# Patient Record
Sex: Female | Born: 1975 | Race: White | Hispanic: No | Smoking: Never smoker
Health system: Southern US, Community
[De-identification: ages and names within clinical notes are randomized; demographics above are authoritative.]

---

## 2004-07-19 ENCOUNTER — Other Ambulatory Visit: Admission: RE | Admit: 2004-07-19 | Discharge: 2004-07-19 | Payer: Self-pay | Admitting: Internal Medicine

## 2005-02-14 ENCOUNTER — Other Ambulatory Visit: Admission: RE | Admit: 2005-02-14 | Discharge: 2005-02-14 | Payer: Self-pay | Admitting: Obstetrics and Gynecology

## 2005-05-26 ENCOUNTER — Other Ambulatory Visit: Admission: RE | Admit: 2005-05-26 | Discharge: 2005-05-26 | Payer: Self-pay | Admitting: Obstetrics and Gynecology

## 2005-06-12 ENCOUNTER — Inpatient Hospital Stay (HOSPITAL_COMMUNITY): Admission: AD | Admit: 2005-06-12 | Discharge: 2005-06-12 | Payer: Self-pay | Admitting: Obstetrics and Gynecology

## 2005-06-27 ENCOUNTER — Ambulatory Visit: Payer: Self-pay | Admitting: Cardiology

## 2005-06-30 ENCOUNTER — Encounter: Payer: Self-pay | Admitting: Cardiology

## 2005-06-30 ENCOUNTER — Ambulatory Visit: Payer: Self-pay

## 2005-07-25 ENCOUNTER — Inpatient Hospital Stay (HOSPITAL_COMMUNITY): Admission: EM | Admit: 2005-07-25 | Discharge: 2005-07-27 | Payer: Self-pay | Admitting: Obstetrics and Gynecology

## 2005-08-04 ENCOUNTER — Ambulatory Visit: Admission: RE | Admit: 2005-08-04 | Discharge: 2005-08-04 | Payer: Self-pay | Admitting: Obstetrics and Gynecology

## 2005-09-25 ENCOUNTER — Other Ambulatory Visit: Admission: RE | Admit: 2005-09-25 | Discharge: 2005-09-25 | Payer: Self-pay | Admitting: Obstetrics and Gynecology

## 2009-07-27 ENCOUNTER — Other Ambulatory Visit: Admission: RE | Admit: 2009-07-27 | Discharge: 2009-07-27 | Payer: Self-pay | Admitting: Internal Medicine

## 2010-02-07 ENCOUNTER — Ambulatory Visit (HOSPITAL_COMMUNITY): Admission: RE | Admit: 2010-02-07 | Discharge: 2010-02-07 | Payer: Self-pay | Admitting: Obstetrics and Gynecology

## 2010-08-11 LAB — CBC
MCH: 32.6 pg (ref 26.0–34.0)
MCHC: 34.7 g/dL (ref 30.0–36.0)
Platelets: 232 10*3/uL (ref 150–400)
RBC: 3.95 MIL/uL (ref 3.87–5.11)

## 2010-08-11 LAB — PREGNANCY, URINE: Preg Test, Ur: NEGATIVE

## 2010-10-14 NOTE — H&P (Signed)
NAME:  Kathy Stanley, Kathy Stanley NO.:  [id number]   MEDICAL RECORD NO.:  [id number]          PATIENT TYPE:  AMB   LOCATION:  SDC                           FACILITY:  [facility]   PHYSICIAN:  Huel Cote, M.D. DATE OF BIRTH:  03/22/1976   DATE OF ADMISSION:  DATE OF DISCHARGE:                                HISTORY & PHYSICAL   HISTORY OF PRESENT ILLNESS:  The patient is a 35 year old G1, P1, who is  coming in for an outpatient LEEP procedure performed under sedation.  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  Her ECC on this specimen was negative.  During the procedure of  her colposcopy the patient had two vagal episodes and it took [place] quite a bit  of supportive care to get her through the procedure.  Therefore it is felt  best that she have her LEEP under sedation.   PAST MEDICAL HISTORY:  Significant for anxiety and depression.  The patient  reports that she has had an anxious nature her entire life and has had  several episodes of syncope related to stress events.   PAST SURGICAL HISTORY:  None.   PAST GYN HISTORY:  Previous Pap smears as stated.   PAST OBSTETRICAL HISTORY:  She had normal spontaneous vaginal delivery in  2002.   CURRENT MEDICATIONS:  Lexapro and doxycycline.   ALLERGIES:  1.  She is allergic to [facility].  2.  SULFA DRUGS in general.   SOCIAL HISTORY:  She is in a steady relationship and is planning on getting  married in the fall.  Her current birth control is condoms and withdrawal  and her menses are regular.   PHYSICAL EXAMINATION:  VITAL SIGNS:  The patient's height is 5 feet 1 inch,  weight 125 pounds.  CARDIAC:  Regular rate and rhythm.  LUNGS:  Clear.  ABDOMEN:  Soft and nontender.  PELVIC:  On pelvic exam she has normal external genitalia.  Her uterus is  normal in size.  Adnexa have no masses.  Her colposcopy  was as stated with  abnormal biopsies in two positions on the cervix.  We will have the patient  perform a pregnancy test on the day of surgery, given her sketchy  contraception.  The risks and benefits of the procedure were explained to  the patient in detail.  She understands the risk of bleeding and infection  and possible weakening of the cervix.  The patient desires to proceed with  the procedure as stated.      KR/MEDQ  D:  10/10/2004  T:  10/10/2004  Job:  [id number]

## 2010-10-14 NOTE — Discharge Summary (Signed)
[name], Stanley NO.:  [id number]   MEDICAL RECORD NO.:  [id number]          PATIENT TYPE:  INP   LOCATION:  9137                          FACILITY:  [facility]   PHYSICIAN:  Huel Cote, M.D. DATE OF BIRTH:  [birth year]   DATE OF ADMISSION:  07/25/2005  DATE OF DISCHARGE:  07/27/2005                                 DISCHARGE SUMMARY   DISCHARGE DIAGNOSES:  1.  Term pregnancy at 40+ weeks delivered.  2.  Status post normal spontaneous vaginal delivery.   DISCHARGE MEDICATIONS:  1.  Motrin 600 mg every six hours.  2.  Percocet one to two tablets p.r.n. every four hours.   HOSPITAL COURSE:  The patient is a 35 year old, gravida 2, para 1-0-0-1, who  came in at [redacted] weeks gestation for induction of labor with favorable cervix.  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  The  patient had declined LEEP procedure for this treatment and was followed with  colpo and Pap smear at 32 weeks.  She also had some episodes of tachycardia  for which she saw a cardiologist and was felt benign.  Prenatal labs were as  follows:  A positive, antibody negative, RPR nonreactive.  Rubella immune.  Hepatitis B surface antigen negative.  HIV nonreactive.  GC negative,  Chlamydia negative.  Group B strep negative.   OB HISTORY:  In May 2002 she had a vaginal delivery of a 6 pound 10 ounce  infant.   GYN HISTORY:  CIN-II.  Needs a colpo and Pap smear postpartum.   PAST MEDICAL HISTORY:  History of anxiety and depression not requiring  medications.   PAST SURGICAL HISTORY:  None.   ALLERGIES:  None.   On admission she was afebrile with 5/5 stable. Cervix shortly after  admission on Pitocin was 75, 5, -1 station.  She quickly reached complete  dilation and pushed for approximately 40 minutes with normal spontaneous  vaginal delivery of a viable female infant over an intact perineum.  Apgars  were 7 and 9, weight 7 pounds 15 ounces.  The baby rotated OP to OA  with  pushing.  A moderate shoulder dystocia was controlled with McRoberts and  suprapubic pressure with a corkscrew maneuver.  Perineum, cervix and rectum  intact.  The patient then did very well postpartum.  On postpartum day #2,  she had no complaints.  She was afebrile with stable vital signs and was  felt stable for discharge home.     Huel Cote, M.D.  Electronically Signed    KR/MEDQ  D:  08/17/2005  T:  08/18/2005  Job:  [id number]

## 2019-03-18 ENCOUNTER — Other Ambulatory Visit: Payer: Self-pay

## 2019-03-18 DIAGNOSIS — Z20822 Contact with and (suspected) exposure to covid-19: Secondary | ICD-10-CM

## 2019-03-19 LAB — NOVEL CORONAVIRUS, NAA: SARS-CoV-2, NAA: NOT DETECTED

## 2019-04-04 ENCOUNTER — Other Ambulatory Visit: Payer: Self-pay

## 2019-04-04 DIAGNOSIS — Z20822 Contact with and (suspected) exposure to covid-19: Secondary | ICD-10-CM

## 2019-04-06 LAB — NOVEL CORONAVIRUS, NAA: SARS-CoV-2, NAA: NOT DETECTED

## 2019-04-11 ENCOUNTER — Other Ambulatory Visit: Payer: Self-pay

## 2019-04-11 DIAGNOSIS — Z20822 Contact with and (suspected) exposure to covid-19: Secondary | ICD-10-CM

## 2019-04-14 LAB — NOVEL CORONAVIRUS, NAA: SARS-CoV-2, NAA: NOT DETECTED

## 2020-07-26 ENCOUNTER — Emergency Department (HOSPITAL_COMMUNITY): Payer: 59

## 2020-07-26 ENCOUNTER — Emergency Department (HOSPITAL_COMMUNITY)
Admission: EM | Admit: 2020-07-26 | Discharge: 2020-07-26 | Disposition: A | Discharge status: Home / Self Care | Payer: 59 | Attending: Emergency Medicine | Admitting: Emergency Medicine

## 2020-07-26 ENCOUNTER — Other Ambulatory Visit: Payer: Self-pay

## 2020-07-26 ENCOUNTER — Encounter (HOSPITAL_COMMUNITY): Payer: Self-pay

## 2020-07-26 DIAGNOSIS — S6991XA Unspecified injury of right wrist, hand and finger(s), initial encounter: Secondary | ICD-10-CM | POA: Diagnosis present

## 2020-07-26 DIAGNOSIS — M79641 Pain in right hand: Secondary | ICD-10-CM

## 2020-07-26 DIAGNOSIS — S60221A Contusion of right hand, initial encounter: Secondary | ICD-10-CM | POA: Insufficient documentation

## 2020-07-26 DIAGNOSIS — W228XXA Striking against or struck by other objects, initial encounter: Secondary | ICD-10-CM | POA: Diagnosis not present

## 2020-07-26 MED ORDER — IBUPROFEN 800 MG PO TABS
800.0000 mg | ORAL_TABLET | Freq: Once | ORAL | Status: AC
  Administered 2020-07-26: 800 mg via ORAL
  Filled 2020-07-26: qty 1

## 2020-07-26 NOTE — ED Triage Notes (Signed)
Patient states she was half asleep and hit her right hand on the door jamb.

## 2020-07-26 NOTE — ED Provider Notes (Signed)
[facility]-EMERGENCY DEPT Provider Note   CSN: [id number] Arrival date & time: 07/26/20  1248     History Chief Complaint  Patient presents with  . Hand Injury    Kathy Stanley is a 45 y.o. female with no significant past medical history who presents to the ED due to right hand pain x1 day.  Patient states she slammed her right hand into a door yesterday.  Pain is located around 1st metacarpal associated with ecchymosis on thenar eminence.  Patient is right-hand dominant.  No previous right hand injuries.  Pain is worse with movement of right thumb.  Patient rates her pain a 7/10.  She has been taking Tylenol with moderate relief.  Denies associated numbness/tingling.  History obtained from patient and past medical records. No interpreter used during encounter.      History reviewed. No pertinent past medical history.  There are no problems to display for this patient.   History reviewed. No pertinent surgical history.   OB History   No obstetric history on file.     Family History  Problem Relation Age of Onset  . Bipolar disorder Mother     Social History   Tobacco Use  . Smoking status: Never Smoker  . Smokeless tobacco: Never Used  Vaping Use  . Vaping Use: Never used  Substance Use Topics  . Alcohol use: Yes  . Drug use: Never    Home Medications Prior to Admission medications   Not on File    Allergies    Septra [sulfamethoxazole-trimethoprim]  Review of Systems   Review of Systems  Musculoskeletal: Positive for arthralgias.  Neurological: Negative for numbness.    Physical Exam Updated Vital Signs BP 137/84 (BP Location: Left Arm)   Pulse 89   Temp 98.7 F (37.1 C) (Oral)   Resp 16   Ht 5' (1.524 m)   Wt 63.5 kg   LMP 07/12/2020   SpO2 100%   BMI 27.34 kg/m   Physical Exam Vitals and nursing note reviewed.  Constitutional:      General: She is not in acute distress. HENT:     Head: Normocephalic.  Eyes:      Pupils: Pupils are equal, round, and reactive to light.  Cardiovascular:     Rate and Rhythm: Normal rate and regular rhythm.     Pulses: Normal pulses.     Heart sounds: Normal heart sounds. No murmur heard. No friction rub. No gallop.   Pulmonary:     Effort: Pulmonary effort is normal.     Breath sounds: Normal breath sounds.  Abdominal:     General: Abdomen is flat. There is no distension.     Palpations: Abdomen is soft.     Tenderness: There is no abdominal tenderness. There is no guarding or rebound.  Musculoskeletal:     Cervical back: Neck supple.     Comments: Tenderness throughout right thumb. +snuffbox tenderness. Limited ROM of right thumb.  Radial pulse intact.  Soft compartments.  Ecchymosis over right thenar eminence.  Skin:    General: Skin is warm and dry.  Neurological:     General: No focal deficit present.  Psychiatric:        Mood and Affect: Mood normal.        Behavior: Behavior normal.     ED Results / Procedures / Treatments   Labs (all labs ordered are listed, but only abnormal results are displayed) Labs Reviewed - No data to display  EKG  None  Radiology DG Hand Complete Right  Result Date: 07/26/2020 CLINICAL DATA:  Right hand injury. EXAM: RIGHT HAND - COMPLETE 3+ VIEW COMPARISON:  None. FINDINGS: Mild soft tissue swelling. Normal alignment with approximation of the joints. No fracture or focal osseous lesion. No radiopaque foreign body. IMPRESSION: No acute osseous abnormality.  Mild soft tissue swelling. Electronically Signed   By: Stana Bunting M.D.   On: 07/26/2020 13:40    Procedures Procedures   Medications Ordered in ED Medications  ibuprofen (ADVIL) tablet 800 mg (800 mg Oral Given 07/26/20 1401)    ED Course  I have reviewed the triage vital signs and the nursing notes.  Pertinent labs & imaging results that were available during my care of the patient were reviewed by me and considered in my medical decision making (see  chart for details).    MDM Rules/Calculators/A&P                         45 year old female presents to the ED due to right hand pain x1 day after slamming it into a door.  Patient is right-hand dominant.  Upon arrival, vitals all within normal limits.  Patient in no acute distress and non-ill-appearing.  Physical exam significant for right snuffbox tenderness with decreased range of motion of right thumb.  Right upper extremity neurovascularly intact with soft compartments.  Doubt compartment syndrome.  Ecchymosis over thenar eminence.  Ibuprofen given for pain management.  X-ray ordered at triage which I personally reviewed which is negative for any bony fractures.  Given snuffbox tenderness, patient placed in thumb spica.  Hand surgeon number given at discharge.  Advised patient to call today or tomorrow to schedule an appointment for further evaluation.  Encouraged  patient take ibuprofen or Tylenol as needed for pain.  RICE discussed with patient. Strict ED precautions discussed with patient. Patient states understanding and agrees to plan. Patient discharged home in no acute distress and stable vitals.  Final Clinical Impression(s) / ED Diagnoses Final diagnoses:  Right hand pain    Rx / DC Orders ED Discharge Orders    None       Jesusita Oka 07/26/20 1409    Tegeler, Canary Brim, MD 07/26/20 1750

## 2020-07-26 NOTE — ED Notes (Signed)
An After Visit Summary was printed and given to the patient. Discharge instructions given and no further questions at this time.  

## 2020-07-26 NOTE — Discharge Instructions (Addendum)
As discussed, your x-ray did not show any broken bones.  Given the location of your pain I have placed you in a splint.  I have included the number of the hand surgeon.  Please call to schedule an appointment for further evaluation.  You may take over-the-counter ibuprofen or Tylenol as needed for pain.  Continue to ice and elevate your right hand.  Return to the ER for new or worsening symptoms.

## 2020-08-02 ENCOUNTER — Ambulatory Visit (INDEPENDENT_AMBULATORY_CARE_PROVIDER_SITE_OTHER): Payer: 59 | Admitting: Physician Assistant

## 2020-08-02 ENCOUNTER — Encounter: Payer: Self-pay | Admitting: Physician Assistant

## 2020-08-02 DIAGNOSIS — M79644 Pain in right finger(s): Secondary | ICD-10-CM | POA: Diagnosis not present

## 2020-08-02 NOTE — Progress Notes (Signed)
   Office Visit Note   Patient: Kathy Stanley           Date of Birth: November 08, 1975           MRN: [id number] Visit Date: 08/02/2020              Requested by: No referring provider defined for this encounter. PCP: Patient, No Pcp Per   Assessment & Plan: Visit Diagnoses:  1. Thumb pain, right     Plan: We will have her continue the thumb spica splint on the right hand.  Begin using Voltaren gel 2 g 4 times daily to the right thumb.  Follow-up with [place] in 2 weeks she continues to have pain we will repeat radiographs to rule out occult fracture.  Questions were encouraged and answered at length.  Follow-Up Instructions: Return in about 2 weeks (around 08/16/2020).   Orders:  No orders of the defined types were placed in this encounter.  No orders of the defined types were placed in this encounter.     Procedures: No procedures performed   Clinical Data: No additional findings.   Subjective: Chief Complaint  Patient presents with  . Right Hand - Pain    HPI Patient is 45 year old female were seen for the first time for right thumb pain.  She was seen in the ER on 07/26/2020 after jamming her fingers on the door jam the middle of the night.  She is unsure of the exact mechanism of injury.  She had radiographs which I reviewed of her right hand in the ER.  These show no acute fractures no bony abnormalities.  She is placed appropriately in a removable wrist splint with thumb spica.  She has been taking ibuprofen.  She is slowly trending towards improvement.  She is right-hand dominant. Review of Systems See HPI otherwise negative  Objective: Vital Signs: LMP 07/12/2020   Physical Exam General: Well-developed well-nourished female no acute distress. Psych: Alert and oriented x3 Ortho Exam Right hand slight edema of the right thumb compared to the left thumb.  She has near full extension of both thumbs.  She has full flexion of both thumbs.  She has tenderness over the first  extensor compartment region of the right thumb region.  No gross instability with valgus varus stressing of the right thumb.  Nontender over the right snuffbox region.  Full sensation throughout the hand.  Radial pulse on the right is 2+. Specialty Comments:  No specialty comments available.  Imaging: No results found.   PMFS History: There are no problems to display for this patient.  History reviewed. No pertinent past medical history.  Family History  Problem Relation Age of Onset  . Bipolar disorder Mother     History reviewed. No pertinent surgical history. Social History   Occupational History  . Not on file  Tobacco Use  . Smoking status: Never Smoker  . Smokeless tobacco: Never Used  Vaping Use  . Vaping Use: Never used  Substance and Sexual Activity  . Alcohol use: Yes  . Drug use: Never  . Sexual activity: Not on file

## 2020-08-25 ENCOUNTER — Ambulatory Visit (INDEPENDENT_AMBULATORY_CARE_PROVIDER_SITE_OTHER): Payer: 59

## 2020-08-25 ENCOUNTER — Encounter: Payer: Self-pay | Admitting: Physician Assistant

## 2020-08-25 ENCOUNTER — Ambulatory Visit (INDEPENDENT_AMBULATORY_CARE_PROVIDER_SITE_OTHER): Payer: 59 | Admitting: Physician Assistant

## 2020-08-25 DIAGNOSIS — M79644 Pain in right finger(s): Secondary | ICD-10-CM

## 2020-08-25 NOTE — Progress Notes (Signed)
HPI: Kathy Stanley returns today follow-up of her right thumb pain.  Injury occurred on 07/26/2020 when she jammed her finger into a door jam the middle night.  She has been wearing the wrist splint with thumb spica.  She feels a clicking-like sensation she states this is audible.  She did feel that the thumb was getting better and then it got worse.  Physical exam: Right hand slight edema of the right thumb compared to left.  She has full extension of both thumbs.  Full flexion of the left thumb.  Slightly decreased flexion of the right thumb through the IP joint and it MP joint.  Nontender over the snuffbox region.  No gross instability valgus varus stressing right thumb.  Remainder the hand is nontender.  Radiographs: Right thumb 3 views shows no acute fracture.  No subluxation dislocation.  No bony abnormalities.  No evidence of fracture involving the scaphoid.  Impression: Right thumb pain Differential bony contusion right thumb  Plan: We will discontinue the thumb spica splint.  She is work on gentle range of motion.  We will send her to formal therapy for her hand therapist to work on range of motion of the thumb to include modalities.  Prescriptions given.  She will follow-up with [place] in 4 weeks to check her progress.  Questions were encouraged and answered.

## 2021-02-05 ENCOUNTER — Other Ambulatory Visit: Payer: Self-pay

## 2021-02-05 ENCOUNTER — Encounter (HOSPITAL_COMMUNITY): Payer: Self-pay | Admitting: Emergency Medicine

## 2021-02-05 ENCOUNTER — Emergency Department (HOSPITAL_COMMUNITY): Payer: 59

## 2021-02-05 ENCOUNTER — Emergency Department (HOSPITAL_COMMUNITY)
Admission: EM | Admit: 2021-02-05 | Discharge: 2021-02-06 | Disposition: A | Discharge status: Home / Self Care | Payer: 59 | Attending: Emergency Medicine | Admitting: Emergency Medicine

## 2021-02-05 DIAGNOSIS — M79645 Pain in left finger(s): Secondary | ICD-10-CM | POA: Insufficient documentation

## 2021-02-05 DIAGNOSIS — S6992XA Unspecified injury of left wrist, hand and finger(s), initial encounter: Secondary | ICD-10-CM | POA: Diagnosis present

## 2021-02-05 DIAGNOSIS — S0512XA Contusion of eyeball and orbital tissues, left eye, initial encounter: Secondary | ICD-10-CM | POA: Diagnosis not present

## 2021-02-05 DIAGNOSIS — T07XXXA Unspecified multiple injuries, initial encounter: Secondary | ICD-10-CM

## 2021-02-05 DIAGNOSIS — S63259A Unspecified dislocation of unspecified finger, initial encounter: Secondary | ICD-10-CM

## 2021-02-05 DIAGNOSIS — S63287A Dislocation of proximal interphalangeal joint of left little finger, initial encounter: Secondary | ICD-10-CM | POA: Diagnosis not present

## 2021-02-05 MED ORDER — LIDOCAINE HCL (PF) 1 % IJ SOLN
30.0000 mL | Freq: Once | INTRAMUSCULAR | Status: AC
  Administered 2021-02-05: 30 mL
  Filled 2021-02-05: qty 30

## 2021-02-05 MED ORDER — OXYCODONE-ACETAMINOPHEN 5-325 MG PO TABS
2.0000 | ORAL_TABLET | Freq: Once | ORAL | Status: AC
  Administered 2021-02-05: 2 via ORAL
  Filled 2021-02-05: qty 2
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
## 2021-02-06 ENCOUNTER — Emergency Department (HOSPITAL_COMMUNITY): Payer: 59

## 2021-02-06 DIAGNOSIS — S63287A Dislocation of proximal interphalangeal joint of left little finger, initial encounter: Secondary | ICD-10-CM | POA: Diagnosis not present

## 2021-02-06 MED ORDER — OXYCODONE-ACETAMINOPHEN 5-325 MG PO TABS: 1.0000 | ORAL_TABLET | Freq: Three times a day (TID) | ORAL | 0 refills | Status: DC | PRN

## 2021-03-03 ENCOUNTER — Ambulatory Visit (INDEPENDENT_AMBULATORY_CARE_PROVIDER_SITE_OTHER): Payer: 59

## 2021-03-03 ENCOUNTER — Ambulatory Visit (INDEPENDENT_AMBULATORY_CARE_PROVIDER_SITE_OTHER): Payer: 59 | Admitting: Orthopedic Surgery

## 2021-03-03 ENCOUNTER — Encounter: Payer: Self-pay | Admitting: Orthopedic Surgery

## 2021-03-03 VITALS — BP 135/85 | HR 94 | Ht 60.0 in | Wt 152.8 lb

## 2021-03-03 DIAGNOSIS — M79645 Pain in left finger(s): Secondary | ICD-10-CM | POA: Diagnosis not present

## 2021-03-03 DIAGNOSIS — S63285A Dislocation of proximal interphalangeal joint of left ring finger, initial encounter: Secondary | ICD-10-CM

## 2021-03-03 NOTE — Progress Notes (Signed)
Office Visit Note   Patient: Kathy Stanley           Date of Birth: 1976/03/09           MRN: [id number] Visit Date: 03/03/2021              Requested by: No referring provider defined for this encounter. PCP: Patient, No Pcp Per (Inactive)   Assessment & Plan: Visit Diagnoses:  1. Pain in left finger(s)   2. Closed traumatic dislocation of proximal interphalangeal (PIP) joint of left ring finger     Plan: Discussed with patient that the x-rays taken today show that the ring finger PIP joint has remained concentrically reduced.  We discussed the nature of PIP injury with prolonged swelling and propensity for stiffness. I will refer her to hand therapy to work on edema control and ROM.   Follow-Up Instructions: No follow-ups on file.   Orders:  Orders Placed This Encounter  Procedures   XR Finger Ring Left   Ambulatory referral to Occupational Therapy   No orders of the defined types were placed in this encounter.     Procedures: No procedures performed   Clinical Data: No additional findings.   Subjective: Chief Complaint  Patient presents with   Left Hand - Follow-up    This is a 45 yo RHD F who presents with left ring finger pain, swelling, and stiffness after a dislocation on 9/10.  She was seen in the ER where the PIP joint was closed reduced.  She did not follow up with hand surgery.  She describes continued but improved swelling and pain at the PIP joint.  She has tried to work on moving the finger.  She denies injury to other joints in this finger or the remainder of her hand.    Review of Systems  Constitutional: Negative.   Respiratory: Negative.    Cardiovascular: Negative.   Skin: Negative.     Objective: Vital Signs: BP 135/85 (BP Location: Right Arm, Patient Position: Sitting, Cuff Size: Normal)   Pulse 94   Ht 5' (1.524 m)   Wt 152 lb 12.8 oz (69.3 kg)   SpO2 96%   BMI 29.84 kg/m   Physical Exam Constitutional:      Appearance: Normal  appearance.  Cardiovascular:     Rate and Rhythm: Normal rate.     Pulses: Normal pulses.  Pulmonary:     Effort: Pulmonary effort is normal.  Skin:    General: Skin is warm and dry.     Capillary Refill: Capillary refill takes less than 2 seconds.  Neurological:     Mental Status: She is alert.    Left Hand Exam   Tenderness  Left hand tenderness location: Mild to moderate TTP over dorsal and ulnar aspect of PIPJ.   Range of Motion  The patient has normal left wrist ROM.  Muscle Strength  The patient has normal left wrist strength.  Other  Erythema: absent Sensation: normal Pulse: present  Comments:  Moderate swelling of ring finger PIP joint.  ROM of PIP from 0-70 degrees.  Full and painless ROM at ring finger MP and DIPJs.      Specialty Comments:  No specialty comments available.  Imaging: 3V of the left ring finger taken today are reviewed and interpreted by me.  They demonstrate concentric reduction of the ring finger PIP joint w/out evidence of prior fracture.  There are no appreciable degenerative changes at the PIP joint.    PMFS  History: There are no problems to display for this patient.  History reviewed. No pertinent past medical history.  Family History  Problem Relation Age of Onset   Bipolar disorder Mother     History reviewed. No pertinent surgical history. Social History   Occupational History   Not on file  Tobacco Use   Smoking status: Never   Smokeless tobacco: Never  Vaping Use   Vaping Use: Never used  Substance and Sexual Activity   Alcohol use: Yes   Drug use: Never   Sexual activity: Not on file

## 2021-05-30 ENCOUNTER — Telehealth: Payer: Medicaid Other | Admitting: Physician Assistant

## 2021-05-30 DIAGNOSIS — M940 Chondrocostal junction syndrome [Tietze]: Secondary | ICD-10-CM

## 2021-05-30 DIAGNOSIS — B9689 Other specified bacterial agents as the cause of diseases classified elsewhere: Secondary | ICD-10-CM

## 2021-05-30 DIAGNOSIS — J208 Acute bronchitis due to other specified organisms: Secondary | ICD-10-CM | POA: Diagnosis not present

## 2021-05-30 MED ORDER — DOXYCYCLINE HYCLATE 100 MG PO TABS: 100.0000 mg | ORAL_TABLET | Freq: Two times a day (BID) | ORAL | 0 refills | Status: DC

## 2021-05-30 MED ORDER — BENZONATATE 100 MG PO CAPS: 100.0000 mg | ORAL_CAPSULE | Freq: Three times a day (TID) | ORAL | 0 refills | Status: DC | PRN

## 2021-05-30 MED ORDER — PREDNISONE 20 MG PO TABS: 40.0000 mg | ORAL_TABLET | Freq: Every day | ORAL | 0 refills | Status: DC

## 2021-05-30 NOTE — Patient Instructions (Signed)
Bailey Mech, thank you for joining Piedad Climes, PA-C for today's virtual visit.  While this provider is not your primary care provider (PCP), if your PCP is located in our provider database this encounter information will be shared with them immediately following your visit.  Consent: (Patient) Kathy Stanley provided verbal consent for this virtual visit at the beginning of the encounter.  Current Medications:  Current Outpatient Medications:    amphetamine-dextroamphetamine (ADDERALL XR) 20 MG 24 hr capsule, dextroamphetamine-amphetamine ER 20 mg 24hr capsule,extend release  take 1 capsule by mouth every morning, Disp: , Rfl:    amphetamine-dextroamphetamine (ADDERALL) 10 MG tablet, Take by mouth., Disp: , Rfl:    metroNIDAZOLE (FLAGYL) 500 MG tablet, metronidazole 500 mg tablet  take 1 tablet by mouth twice a day ; AVOID ALCOHOL CONTAINING PRO...  (REFER TO PRESCRIPTION NOTES)., Disp: , Rfl:    oxyCODONE-acetaminophen (PERCOCET) 5-325 MG tablet, Take 1 tablet by mouth every 8 (eight) hours as needed for severe pain., Disp: 10 tablet, Rfl: 0   sertraline (ZOLOFT) 100 MG tablet, sertraline 100 mg tablet, Disp: , Rfl:    Medications ordered in this encounter:  No orders of the defined types were placed in this encounter.    *If you need refills on other medications prior to your next appointment, please contact your pharmacy*  Follow-Up: Call back or seek an in-person evaluation if the symptoms worsen or if the condition fails to improve as anticipated.  Other Instructions Take antibiotic (Doxycycline) as directed.  Increase fluids.  Get plenty of rest. Use Mucinex for congestion. Use the Tessalon as directed for cough along with the prednisone. Take a daily probiotic (I recommend Align or Culturelle, but even Activia Yogurt may be beneficial).  A humidifier placed in the bedroom may offer some relief for a dry, scratchy throat of nasal irritation.  Read information below on acute  bronchitis. Please call or return to clinic if symptoms are not improving.  Acute Bronchitis Bronchitis is when the airways that extend from the windpipe into the lungs get red, puffy, and painful (inflamed). Bronchitis often causes thick spit (mucus) to develop. This leads to a cough. A cough is the most common symptom of bronchitis. In acute bronchitis, the condition usually begins suddenly and goes away over time (usually in 2 weeks). Smoking, allergies, and asthma can make bronchitis worse. Repeated episodes of bronchitis may cause more lung problems.  HOME CARE Rest. Drink enough fluids to keep your pee (urine) clear or pale yellow (unless you need to limit fluids as told by your doctor). Only take over-the-counter or prescription medicines as told by your doctor. Avoid smoking and secondhand smoke. These can make bronchitis worse. If you are a smoker, think about using nicotine gum or skin patches. Quitting smoking will help your lungs heal faster. Reduce the chance of getting bronchitis again by: Washing your hands often. Avoiding people with cold symptoms. Trying not to touch your hands to your mouth, nose, or eyes. Follow up with your doctor as told.  GET HELP IF: Your symptoms do not improve after 1 week of treatment. Symptoms include: Cough. Fever. Coughing up thick spit. Body aches. Chest congestion. Chills. Shortness of breath. Sore throat.  GET HELP RIGHT AWAY IF:  You have an increased fever. You have chills. You have severe shortness of breath. You have bloody thick spit (sputum). You throw up (vomit) often. You lose too much body fluid (dehydration). You have a severe headache. You faint.  MAKE SURE YOU:  Understand these instructions. Will watch your condition. Will get help right away if you are not doing well or get worse. Document Released: 11/01/2007 Document Revised: 01/15/2013 Document Reviewed: 11/05/2012 [facility] Patient Information 2015  [place], [place]. This information is not intended to replace advice given to you by your health care provider. Make sure you discuss any questions you have with your health care provider.    If you have been instructed to have an in-person evaluation today at a local Urgent Care facility, please use the link below. It will take you to a list of all of our available [facility], including address, phone number and hours of operation. Please do not delay care.  [facility]  If you or a family member do not have a primary care provider, use the link below to schedule a visit and establish care. When you choose a [facility] primary care physician or advanced practice provider, you gain a long-term partner in health. Find a Primary Care Provider  Learn more about [facility]'s in-office and virtual care options: [facility] - Get Care Now

## 2021-05-30 NOTE — Progress Notes (Signed)
Virtual Visit Consent   Kathy Stanley, you are scheduled for a virtual visit with a [facility] provider today.     Just as with appointments in the office, your consent must be obtained to participate.  Your consent will be active for this visit and any virtual visit you may have with one of our providers in the next 365 days.     If you have a MyChart account, a copy of this consent can be sent to you electronically.  All virtual visits are billed to your insurance company just like a traditional visit in the office.    As this is a virtual visit, video technology does not allow for your provider to perform a traditional examination.  This may limit your provider's ability to fully assess your condition.  If your provider identifies any concerns that need to be evaluated in person or the need to arrange testing (such as labs, EKG, etc.), we will make arrangements to do so.     Although advances in technology are sophisticated, we cannot ensure that it will always work on either your end or our end.  If the connection with a video visit is poor, the visit may have to be switched to a telephone visit.  With either a video or telephone visit, we are not always able to ensure that we have a secure connection.     I need to obtain your verbal consent now.   Are you willing to proceed with your visit today?    Kathy Stanley has provided verbal consent on 05/30/2021 for a virtual visit (video or telephone).   Piedad Climes, [place]   Date: 05/30/2021 2:12 PM   Virtual Visit via Video Note   I, Piedad Climes, connected with  Kathy Stanley  ([id number], 02/12/1976) on 05/30/21 at  2:00 PM EST by a video-enabled telemedicine application and verified that I am speaking with the correct person using two identifiers.  Location: Patient: Virtual Visit Location Patient: Home Provider: Virtual Visit Location Provider: Home Office   I discussed the limitations of evaluation and management by  telemedicine and the availability of in person appointments. The patient expressed understanding and agreed to proceed.    History of Present Illness: Kathy Stanley is a 46 y.o. who identifies as a female who was assigned female at birth, and is being seen today for ongoing URI symptoms starting 05/20/2021. Notes initially with aches and fatigue with quick worsening of symptoms and development of fever (100.3) the same day. By Saturday she notes that her fever had resolved but other symptoms continued. Notes now with worsened chest congestion and cough that is now productive of thick dark phlegm, worse at night. Chest wall tenderness with coughing and deep breathe.. Has been taking Nyquil/Dayquil and Robitussin. Notes ears were hurting her yesterday as well. Has taken Ibuprofen for pain.   HPI: HPI  Problems: There are no problems to display for this patient.   Allergies:  Allergies  Allergen Reactions   Septra [Sulfamethoxazole-Trimethoprim]    Sulfa Antibiotics    Medications:  Current Outpatient Medications:    amphetamine-dextroamphetamine (ADDERALL XR) 20 MG 24 hr capsule, dextroamphetamine-amphetamine ER 20 mg 24hr capsule,extend release  take 1 capsule by mouth every morning, Disp: , Rfl:    amphetamine-dextroamphetamine (ADDERALL) 10 MG tablet, Take by mouth., Disp: , Rfl:    sertraline (ZOLOFT) 100 MG tablet, sertraline 100 mg tablet, Disp: , Rfl:   Observations/Objective: Patient is well-developed, well-nourished in no acute distress.  Resting comfortably at home.  Head is normocephalic, atraumatic.  No labored breathing. Speech is clear and coherent with logical content.  Patient is alert and oriented at baseline.   Assessment and Plan: 1. Acute bacterial bronchitis  Rx Doxycycline.  Increase fluids.  Rest.  Saline nasal spray.  Probiotic.  Mucinex as directed.  Humidifier in bedroom. Tessalon per orders.  Call or return to clinic if symptoms are not improving.  2.  Costochondritis  Secondary to excess coughing. Treating bronchitis as noted above with addition of tessalon as well to calm cough. Rx prednisone 40 mg daily x 3 days to help with bronchospasm and inflammation. Tylenol OTC as directed.   Follow Up Instructions: I discussed the assessment and treatment plan with the patient. The patient was provided an opportunity to ask questions and all were answered. The patient agreed with the plan and demonstrated an understanding of the instructions.  A copy of instructions were sent to the patient via MyChart unless otherwise noted below.    The patient was advised to call back or seek an in-person evaluation if the symptoms worsen or if the condition fails to improve as anticipated.  Time:  I spent 12 minutes with the patient via telehealth technology discussing the above problems/concerns.    Piedad Climes, PA-C

## 2021-07-27 ENCOUNTER — Other Ambulatory Visit: Payer: Self-pay | Admitting: Internal Medicine

## 2021-07-27 ENCOUNTER — Ambulatory Visit: Payer: Medicaid Other | Admitting: Internal Medicine

## 2021-07-27 ENCOUNTER — Ambulatory Visit (INDEPENDENT_AMBULATORY_CARE_PROVIDER_SITE_OTHER): Payer: PRIVATE HEALTH INSURANCE

## 2021-07-27 ENCOUNTER — Encounter: Payer: Self-pay | Admitting: Internal Medicine

## 2021-07-27 VITALS — BP 106/66 | HR 92 | Temp 98.2°F | Resp 13 | Ht 60.0 in | Wt 159.0 lb

## 2021-07-27 DIAGNOSIS — R058 Other specified cough: Secondary | ICD-10-CM | POA: Diagnosis not present

## 2021-07-27 DIAGNOSIS — J22 Unspecified acute lower respiratory infection: Secondary | ICD-10-CM | POA: Diagnosis not present

## 2021-07-27 MED ORDER — HYDROCODONE BIT-HOMATROP MBR 5-1.5 MG/5ML PO SOLN: 5.0000 mL | Freq: Three times a day (TID) | ORAL | 0 refills | Status: AC | PRN

## 2021-07-27 MED ORDER — AMOXICILLIN-POT CLAVULANATE 875-125 MG PO TABS: 1.0000 | ORAL_TABLET | Freq: Two times a day (BID) | ORAL | 0 refills | Status: AC

## 2021-07-27 NOTE — Progress Notes (Signed)
? ?Subjective:  ?Patient ID: Kathy Stanley, female    DOB: 11-Sep-1975  Age: 46 y.o. MRN: [id number] ? ?CC: Cough ? ?This visit occurred during the SARS-CoV-2 public health emergency.  Safety protocols were in place, including screening questions prior to the visit, additional usage of staff PPE, and extensive cleaning of exam room while observing appropriate contact time as indicated for disinfecting solutions.   ? ?HPI ?Kathy Stanley presents for a one week hx of cough productive of thick purulent (yellow/brown/green phlegm) with LGF. ? ?History ?Kathy Stanley has no past medical history on file.  ? ?She has no past surgical history on file.  ? ?Her family history includes Bipolar disorder in her mother.She reports that she has never smoked. She has never used smokeless tobacco. She reports current alcohol use. She reports that she does not use drugs. ? ?Outpatient Medications Prior to Visit  ?Medication Sig Dispense Refill  ? amphetamine-dextroamphetamine (ADDERALL XR) 20 MG 24 hr capsule dextroamphetamine-amphetamine ER 20 mg 24hr capsule,extend release ? take 1 capsule by mouth every morning    ? amphetamine-dextroamphetamine (ADDERALL) 10 MG tablet Take by mouth.    ? benzonatate (TESSALON) 100 MG capsule Take 1 capsule (100 mg total) by mouth 3 (three) times daily as needed for cough. 30 capsule 0  ? predniSONE (DELTASONE) 20 MG tablet Take 2 tablets (40 mg total) by mouth daily with breakfast. 6 tablet 0  ? sertraline (ZOLOFT) 100 MG tablet sertraline 100 mg tablet    ? doxycycline (VIBRA-TABS) 100 MG tablet Take 1 tablet (100 mg total) by mouth 2 (two) times daily. 14 tablet 0  ? ?No facility-administered medications prior to visit.  ? ? ?ROS ?Review of Systems  ?Constitutional:  Positive for fever. Negative for chills and fatigue.  ?HENT:  Positive for postnasal drip, rhinorrhea, sinus pressure and sinus pain. Negative for facial swelling, sore throat and trouble swallowing.   ?Respiratory:  Positive for cough.  Negative for chest tightness, shortness of breath and wheezing.   ?Cardiovascular:  Negative for chest pain, palpitations and leg swelling.  ?Gastrointestinal:  Negative for constipation, diarrhea, nausea and vomiting.  ?Genitourinary: Negative.   ?Musculoskeletal: Negative.   ?Skin: Negative.  Negative for color change and rash.  ?Neurological:  Negative for dizziness, weakness and headaches.  ?Hematological:  Negative for adenopathy. Does not bruise/bleed easily.  ?Psychiatric/Behavioral: Negative.    ? ?Objective:  ?BP 106/66 (BP Location: Left Arm, Patient Position: Sitting, Cuff Size: Large)   Pulse 92   Temp 98.2 ?F (36.8 ?C) (Oral)   Resp 13   Ht 5' (1.524 m)   Wt 159 lb (72.1 kg)   LMP 07/12/2021 (Exact Date)   SpO2 97%   BMI 31.05 kg/m?  ? ?Physical Exam ?Vitals reviewed.  ?Constitutional:   ?   General: She is not in acute distress. ?   Appearance: She is not ill-appearing, toxic-appearing or diaphoretic.  ?HENT:  ?   Nose: Nose normal.  ?   Mouth/Throat:  ?   Mouth: Mucous membranes are moist.  ?Eyes:  ?   General: No scleral icterus. ?   Conjunctiva/sclera: Conjunctivae normal.  ?Cardiovascular:  ?   Rate and Rhythm: Normal rate and regular rhythm.  ?   Heart sounds: No murmur heard. ?Pulmonary:  ?   Effort: Pulmonary effort is normal.  ?   Breath sounds: No stridor. No wheezing, rhonchi or rales.  ?Abdominal:  ?   General: Abdomen is flat.  ?   Palpations: There is no mass.  ?  Tenderness: There is no abdominal tenderness. There is no guarding.  ?   Hernia: No hernia is present.  ?Musculoskeletal:     ?   General: Normal range of motion.  ?   Cervical back: Neck supple.  ?   Right lower leg: No edema.  ?   Left lower leg: No edema.  ?Lymphadenopathy:  ?   Cervical: No cervical adenopathy.  ?Skin: ?   General: Skin is warm.  ?   Coloration: Skin is not pale.  ?   Findings: No rash.  ?Neurological:  ?   General: No focal deficit present.  ?Psychiatric:     ?   Mood and Affect: Mood normal.     ?    Behavior: Behavior normal.  ? ? ?Lab Results  ?Component Value Date  ? WBC 7.0 02/02/2010  ? HGB 12.9 02/02/2010  ? HCT 37.1 02/02/2010  ? PLT 232 02/02/2010  ?  ?DG Chest 2 View ? ?Result Date: 07/27/2021 ?CLINICAL DATA:  Cough for 1 week, short of breath, congestion EXAM: CHEST - 2 VIEW COMPARISON:  None. FINDINGS: Frontal and lateral views of the chest demonstrate an unremarkable cardiac silhouette. No acute airspace disease, effusion, or pneumothorax. No acute bony abnormalities. IMPRESSION: 1. No acute intrathoracic process. Electronically Signed   By: Sharlet Salina M.D.   On: 07/27/2021 16:24    ? ?Assessment & Plan:  ? ?Kathy Stanley was seen today for cough. ? ?Diagnoses and all orders for this visit: ? ?Cough productive of purulent sputum- Her chest x-ray is negative for infiltrate.  Will treat with Augmentin and offer symptom relief. ?-     DG Chest 2 View; Future ?-     HYDROcodone bit-homatropine (HYCODAN) 5-1.5 MG/5ML syrup; Take 5 mLs by mouth every 8 (eight) hours as needed for up to 8 days for cough. ? ?LRTI (lower respiratory tract infection) ?-     amoxicillin-clavulanate (AUGMENTIN) 875-125 MG tablet; Take 1 tablet by mouth 2 (two) times daily for 10 days. ?-     HYDROcodone bit-homatropine (HYCODAN) 5-1.5 MG/5ML syrup; Take 5 mLs by mouth every 8 (eight) hours as needed for up to 8 days for cough. ? ? ?I have discontinued Kathy Stanley's doxycycline. I am also having her start on amoxicillin-clavulanate and HYDROcodone bit-homatropine. Additionally, I am having her maintain her sertraline, amphetamine-dextroamphetamine, amphetamine-dextroamphetamine, predniSONE, and benzonatate. ? ?Meds ordered this encounter  ?Medications  ? amoxicillin-clavulanate (AUGMENTIN) 875-125 MG tablet  ?  Sig: Take 1 tablet by mouth 2 (two) times daily for 10 days.  ?  Dispense:  20 tablet  ?  Refill:  0  ? HYDROcodone bit-homatropine (HYCODAN) 5-1.5 MG/5ML syrup  ?  Sig: Take 5 mLs by mouth every 8 (eight) hours as needed  for up to 8 days for cough.  ?  Dispense:  120 mL  ?  Refill:  0  ? ? ? ?Follow-up: Return if symptoms worsen or fail to improve. ? ?Sanda Linger, MD ?

## 2021-07-27 NOTE — Patient Instructions (Signed)

## 2021-07-28 ENCOUNTER — Encounter: Payer: Self-pay | Admitting: Internal Medicine

## 2021-07-29 ENCOUNTER — Other Ambulatory Visit: Payer: Self-pay | Admitting: Internal Medicine

## 2021-07-29 ENCOUNTER — Telehealth: Payer: Self-pay | Admitting: Internal Medicine

## 2021-07-29 DIAGNOSIS — F3342 Major depressive disorder, recurrent, in full remission: Secondary | ICD-10-CM

## 2021-07-29 DIAGNOSIS — F900 Attention-deficit hyperactivity disorder, predominantly inattentive type: Secondary | ICD-10-CM

## 2021-07-29 MED ORDER — SERTRALINE HCL 100 MG PO TABS: ORAL_TABLET | ORAL | 1 refills | Status: DC

## 2021-07-29 MED ORDER — AMPHETAMINE-DEXTROAMPHET ER 20 MG PO CP24: ORAL_CAPSULE | ORAL | 0 refills | Status: DC

## 2021-07-29 NOTE — Telephone Encounter (Signed)
Pharmacy states a rx for sertraline (ZOLOFT) 100 MG tablet was sent in w/o directions ? ?Please advise ?

## 2021-07-30 ENCOUNTER — Other Ambulatory Visit: Payer: Self-pay | Admitting: Internal Medicine

## 2021-07-30 DIAGNOSIS — F3342 Major depressive disorder, recurrent, in full remission: Secondary | ICD-10-CM

## 2021-07-30 MED ORDER — SERTRALINE HCL 100 MG PO TABS: 100.0000 mg | ORAL_TABLET | Freq: Every day | ORAL | 1 refills | Status: DC

## 2021-12-01 ENCOUNTER — Other Ambulatory Visit: Payer: Self-pay | Admitting: Internal Medicine

## 2021-12-01 DIAGNOSIS — F900 Attention-deficit hyperactivity disorder, predominantly inattentive type: Secondary | ICD-10-CM

## 2021-12-02 MED ORDER — AMPHETAMINE-DEXTROAMPHET ER 20 MG PO CP24: ORAL_CAPSULE | ORAL | 0 refills | Status: DC

## 2022-02-03 ENCOUNTER — Other Ambulatory Visit: Payer: Self-pay | Admitting: Internal Medicine

## 2022-02-03 DIAGNOSIS — F3342 Major depressive disorder, recurrent, in full remission: Secondary | ICD-10-CM

## 2022-02-21 ENCOUNTER — Other Ambulatory Visit: Payer: Self-pay | Admitting: Internal Medicine

## 2022-02-21 DIAGNOSIS — F900 Attention-deficit hyperactivity disorder, predominantly inattentive type: Secondary | ICD-10-CM

## 2022-05-31 ENCOUNTER — Encounter: Payer: Self-pay | Admitting: Internal Medicine

## 2022-05-31 ENCOUNTER — Ambulatory Visit (INDEPENDENT_AMBULATORY_CARE_PROVIDER_SITE_OTHER): Payer: Medicaid Other | Admitting: Internal Medicine

## 2022-05-31 VITALS — BP 126/80 | HR 63 | Temp 98.5°F | Resp 16 | Ht 60.0 in | Wt 169.0 lb

## 2022-05-31 DIAGNOSIS — F3342 Major depressive disorder, recurrent, in full remission: Secondary | ICD-10-CM | POA: Diagnosis not present

## 2022-05-31 DIAGNOSIS — R635 Abnormal weight gain: Secondary | ICD-10-CM | POA: Diagnosis not present

## 2022-05-31 DIAGNOSIS — Z1211 Encounter for screening for malignant neoplasm of colon: Secondary | ICD-10-CM | POA: Insufficient documentation

## 2022-05-31 DIAGNOSIS — F900 Attention-deficit hyperactivity disorder, predominantly inattentive type: Secondary | ICD-10-CM | POA: Diagnosis not present

## 2022-05-31 DIAGNOSIS — R102 Pelvic and perineal pain: Secondary | ICD-10-CM | POA: Diagnosis not present

## 2022-05-31 DIAGNOSIS — Z1159 Encounter for screening for other viral diseases: Secondary | ICD-10-CM | POA: Insufficient documentation

## 2022-05-31 DIAGNOSIS — Z124 Encounter for screening for malignant neoplasm of cervix: Secondary | ICD-10-CM | POA: Insufficient documentation

## 2022-05-31 DIAGNOSIS — Z0001 Encounter for general adult medical examination with abnormal findings: Secondary | ICD-10-CM | POA: Diagnosis not present

## 2022-05-31 DIAGNOSIS — Z114 Encounter for screening for human immunodeficiency virus [HIV]: Secondary | ICD-10-CM | POA: Insufficient documentation

## 2022-05-31 LAB — CBC WITH DIFFERENTIAL/PLATELET
Basophils Absolute: 0.1 10*3/uL (ref 0.0–0.1)
Basophils Relative: 1.3 % (ref 0.0–3.0)
Eosinophils Absolute: 0.1 10*3/uL (ref 0.0–0.7)
Eosinophils Relative: 2.6 % (ref 0.0–5.0)
HCT: 37.7 % (ref 36.0–46.0)
Hemoglobin: 12.9 g/dL (ref 12.0–15.0)
Lymphocytes Relative: 34.3 % (ref 12.0–46.0)
Lymphs Abs: 1.5 10*3/uL (ref 0.7–4.0)
MCHC: 34.4 g/dL (ref 30.0–36.0)
MCV: 92.5 fl (ref 78.0–100.0)
Monocytes Absolute: 0.4 10*3/uL (ref 0.1–1.0)
Monocytes Relative: 8.7 % (ref 3.0–12.0)
Neutro Abs: 2.3 10*3/uL (ref 1.4–7.7)
Neutrophils Relative %: 53.1 % (ref 43.0–77.0)
Platelets: 334 10*3/uL (ref 150.0–400.0)
RBC: 4.07 Mil/uL (ref 3.87–5.11)
RDW: 14.3 % (ref 11.5–15.5)
WBC: 4.4 10*3/uL (ref 4.0–10.5)

## 2022-05-31 LAB — BASIC METABOLIC PANEL
BUN: 13 mg/dL (ref 6–23)
CO2: 26 mEq/L (ref 19–32)
Calcium: 8.8 mg/dL (ref 8.4–10.5)
Chloride: 102 mEq/L (ref 96–112)
Creatinine, Ser: 0.74 mg/dL (ref 0.40–1.20)
GFR: 96.97 mL/min (ref >60.00)
Glucose, Bld: 93 mg/dL (ref 70–99)
Potassium: 3.9 mEq/L (ref 3.5–5.1)
Sodium: 136 mEq/L (ref 135–145)

## 2022-05-31 LAB — LIPID PANEL
Cholesterol: 215 mg/dL — ABNORMAL HIGH (ref 0–200)
HDL: 49.1 mg/dL (ref >39.00)
LDL Cholesterol: 148 mg/dL — ABNORMAL HIGH (ref 0–99)
NonHDL: 165.55
Total CHOL/HDL Ratio: 4
Triglycerides: 87 mg/dL (ref 0.0–149.0)
VLDL: 17.4 mg/dL (ref 0.0–40.0)

## 2022-05-31 LAB — HEPATIC FUNCTION PANEL
ALT: 13 U/L (ref 0–35)
AST: 21 U/L (ref 0–37)
Albumin: 4.2 g/dL (ref 3.5–5.2)
Alkaline Phosphatase: 43 U/L (ref 39–117)
Bilirubin, Direct: 0.1 mg/dL (ref 0.0–0.3)
Total Bilirubin: 0.4 mg/dL (ref 0.2–1.2)
Total Protein: 7.1 g/dL (ref 6.0–8.3)

## 2022-05-31 LAB — HEMOGLOBIN A1C: Hgb A1c MFr Bld: 5.4 % (ref 4.6–6.5)

## 2022-05-31 LAB — TSH: TSH: 1.41 u[IU]/mL (ref 0.35–5.50)

## 2022-05-31 MED ORDER — SERTRALINE HCL 100 MG PO TABS: 100.0000 mg | ORAL_TABLET | Freq: Two times a day (BID) | ORAL | 0 refills | Status: DC

## 2022-05-31 MED ORDER — AMPHETAMINE-DEXTROAMPHET ER 20 MG PO CP24: ORAL_CAPSULE | ORAL | 0 refills | Status: DC

## 2022-05-31 NOTE — Patient Instructions (Signed)

## 2022-05-31 NOTE — Progress Notes (Signed)
Subjective:  Patient ID: Kathy Stanley, female    DOB: December 21, 1975  Age: 47 y.o. MRN: [id number]  CC: Annual Exam   HPI Kathy Stanley presents for a CPX and f/up -  She complains of a 4-week history of crampy left lower quadrant/pelvic pain.  She denies nausea, vomiting, loss of appetite, weight loss, diarrhea, constipation, dysuria, or hematuria.  She continues to struggle with symptoms of ADHD with lack of focus and difficulty completing tasks.  She would like to stay on the current doses of Adderall and sertraline.  Outpatient Medications Prior to Visit  Medication Sig Dispense Refill   amphetamine-dextroamphetamine (ADDERALL XR) 20 MG 24 hr capsule Take 1 capsule by mouth every morning 30 capsule 0   amphetamine-dextroamphetamine (ADDERALL) 10 MG tablet Take by mouth.     sertraline (ZOLOFT) 100 MG tablet TAKE ONE TABLET BY MOUTH DAILY (Patient taking differently: Take 100 mg by mouth in the morning and at bedtime.) 90 tablet 1   No facility-administered medications prior to visit.    ROS Review of Systems  Constitutional:  Positive for unexpected weight change (wt gain). Negative for appetite change, chills, diaphoresis and fatigue.  HENT: Negative.    Eyes: Negative.   Respiratory: Negative.  Negative for cough, chest tightness, shortness of breath and wheezing.   Cardiovascular:  Negative for chest pain, palpitations and leg swelling.  Gastrointestinal:  Positive for abdominal pain. Negative for constipation, diarrhea, nausea and vomiting.  Genitourinary:  Positive for pelvic pain (left). Negative for decreased urine volume, difficulty urinating, flank pain, frequency, hematuria, menstrual problem, vaginal bleeding and vaginal discharge.  Musculoskeletal: Negative.   Skin: Negative.   Neurological: Negative.  Negative for dizziness, weakness and light-headedness.  Hematological:  Negative for adenopathy. Does not bruise/bleed easily.  Psychiatric/Behavioral:  Positive for  decreased concentration and dysphoric mood. Negative for behavioral problems, confusion, hallucinations, self-injury, sleep disturbance and suicidal ideas. The patient is not nervous/anxious and is not hyperactive.     Objective:  BP 126/80 (BP Location: Left Arm, Patient Position: Sitting, Cuff Size: Large)   Pulse 63   Temp 98.5 F (36.9 C) (Oral)   Resp 16   Ht 5' (1.524 m)   Wt 169 lb (76.7 kg)   LMP 05/23/2022 (Exact Date)   SpO2 99%   BMI 33.01 kg/m   BP Readings from Last 3 Encounters:  05/31/22 126/80  07/27/21 106/66  03/03/21 135/85    Wt Readings from Last 3 Encounters:  05/31/22 169 lb (76.7 kg)  07/27/21 159 lb (72.1 kg)  03/03/21 152 lb 12.8 oz (69.3 kg)    Physical Exam Vitals reviewed.  HENT:     Nose: Nose normal.     Mouth/Throat:     Mouth: Mucous membranes are moist.  Eyes:     General: No scleral icterus.    Conjunctiva/sclera: Conjunctivae normal.  Cardiovascular:     Rate and Rhythm: Normal rate and regular rhythm.     Heart sounds: No murmur heard. Pulmonary:     Effort: Pulmonary effort is normal.     Breath sounds: No stridor. No wheezing, rhonchi or rales.  Abdominal:     General: Abdomen is flat. Bowel sounds are normal. There is no distension.     Palpations: Abdomen is soft. There is no hepatomegaly, splenomegaly or mass.     Tenderness: There is abdominal tenderness in the left lower quadrant. There is no guarding.     Hernia: No hernia is present.  Musculoskeletal:  General: Normal range of motion.     Cervical back: Neck supple.     Right lower leg: No edema.     Left lower leg: No edema.  Lymphadenopathy:     Cervical: No cervical adenopathy.  Skin:    General: Skin is warm and dry.  Neurological:     General: No focal deficit present.     Mental Status: She is alert.  Psychiatric:        Mood and Affect: Mood normal.        Behavior: Behavior normal.     Lab Results  Component Value Date   WBC 4.4 05/31/2022    HGB 12.9 05/31/2022   HCT 37.7 05/31/2022   PLT 334.0 05/31/2022   GLUCOSE 93 05/31/2022   CHOL 215 (H) 05/31/2022   TRIG 87.0 05/31/2022   HDL 49.10 05/31/2022   LDLCALC 148 (H) 05/31/2022   ALT 13 05/31/2022   AST 21 05/31/2022   NA 136 05/31/2022   K 3.9 05/31/2022   CL 102 05/31/2022   CREATININE 0.74 05/31/2022   BUN 13 05/31/2022   CO2 26 05/31/2022   TSH 1.41 05/31/2022   HGBA1C 5.4 05/31/2022    DG Finger Ring Left  Result Date: 02/06/2021 CLINICAL DATA:  Status post reduction of dislocated PIP joint of the fourth digit. EXAM: LEFT RING FINGER 2+V COMPARISON:  Earlier radiograph dated 02/05/2021. FINDINGS: Status post reduction of the previously seen dislocated fourth PIP joint, now in anatomic alignment. No acute fracture. Soft tissue swelling of the fourth digit. IMPRESSION: Successful reduction of the previously seen dislocated fourth PIP joint. Electronically Signed   By: Anner Crete M.D.   On: 02/06/2021 00:31   DG Hand Complete Left  Result Date: 02/05/2021 CLINICAL DATA:  Pain in the left ring finger. EXAM: LEFT HAND - COMPLETE 3+ VIEW COMPARISON:  None. FINDINGS: Dorsal dislocation of the proximal interphalangeal joint of the fourth digit. No definite fracture. Sclerotic lesion involving the middle phalanx of the fifth digit, likely bone island. There is soft tissue swelling of fourth digit. No radiopaque foreign object or soft tissue gas. IMPRESSION: Dorsal dislocation of the proximal interphalangeal joint of the fourth digit. No definite fracture. Electronically Signed   By: Anner Crete M.D.   On: 02/05/2021 22:29    Assessment & Plan:   [name] was seen today for annual exam.  Diagnoses and all orders for this visit:  Need for hepatitis C screening test -     Hepatitis C antibody; Future -     Hepatitis C antibody  Recurrent major depressive disorder, in full remission ([place])- Will continue the current dose of sertraline. -     TSH; Future -      Hepatic function panel; Future -     Hemoglobin A1c; Future -     CBC with Differential/Platelet; Future -     Basic metabolic panel; Future -     sertraline (ZOLOFT) 100 MG tablet; Take 1 tablet (100 mg total) by mouth in the morning and at bedtime. -     Basic metabolic panel -     CBC with Differential/Platelet -     Hemoglobin A1c -     Hepatic function panel -     TSH  ADHD (attention deficit hyperactivity disorder), inattentive type- Will restart Adderall. -     CBC with Differential/Platelet; Future -     CBC with Differential/Platelet -     amphetamine-dextroamphetamine (ADDERALL XR) 20 MG 24 hr  capsule; Take 1 capsule by mouth every morning  Screening for HIV (human immunodeficiency virus) -     HIV Antibody (routine testing w rflx); Future -     HIV Antibody (routine testing w rflx)  Abnormal weight gain- Labs are negative for secondary causes. -     TSH; Future -     Hepatic function panel; Future -     Hemoglobin A1c; Future -     CBC with Differential/Platelet; Future -     Basic metabolic panel; Future -     Basic metabolic panel -     CBC with Differential/Platelet -     Hemoglobin A1c -     Hepatic function panel -     TSH  Encounter for general adult medical examination with abnormal findings- Exam completed, labs reviewed, she refused vaccines today, cancer screenings addressed, patient education was given. -     Lipid panel; Future -     HIV Antibody (routine testing w rflx); Future -     Hepatitis C antibody; Future -     Hepatitis C antibody -     HIV Antibody (routine testing w rflx) -     Lipid panel  Cervical cancer screening -     Ambulatory referral to Gynecology  Screen for colon cancer -     Cologuard  Pelvic pain in female- Will evaluate for pelvic mass. -     US Pelvic Complete With Transvaginal; Future   I have changed Selinda Eon Wease's sertraline. I am also having her maintain her amphetamine-dextroamphetamine.  Meds ordered this  encounter  Medications   sertraline (ZOLOFT) 100 MG tablet    Sig: Take 1 tablet (100 mg total) by mouth in the morning and at bedtime.    Dispense:  180 tablet    Refill:  0   amphetamine-dextroamphetamine (ADDERALL XR) 20 MG 24 hr capsule    Sig: Take 1 capsule by mouth every morning    Dispense:  30 capsule    Refill:  0     Follow-up: Return in about 3 months (around 08/30/2022).  Scarlette Calico, MD

## 2022-06-01 DIAGNOSIS — R102 Pelvic and perineal pain: Secondary | ICD-10-CM | POA: Insufficient documentation

## 2022-06-01 LAB — HEPATITIS C ANTIBODY: Hepatitis C Ab: NONREACTIVE

## 2022-06-01 LAB — HIV ANTIBODY (ROUTINE TESTING W REFLEX): HIV 1&2 Ab, 4th Generation: NONREACTIVE

## 2022-06-04 ENCOUNTER — Encounter: Payer: Self-pay | Admitting: Internal Medicine

## 2022-06-21 ENCOUNTER — Ambulatory Visit
Admission: RE | Admit: 2022-06-21 | Discharge: 2022-06-21 | Disposition: A | Discharge status: Home / Self Care | Payer: PRIVATE HEALTH INSURANCE | Source: Ambulatory Visit | Attending: Internal Medicine | Admitting: Internal Medicine

## 2022-06-21 DIAGNOSIS — R102 Pelvic and perineal pain: Secondary | ICD-10-CM

## 2022-06-29 ENCOUNTER — Other Ambulatory Visit: Payer: Self-pay | Admitting: Internal Medicine

## 2022-06-29 DIAGNOSIS — F900 Attention-deficit hyperactivity disorder, predominantly inattentive type: Secondary | ICD-10-CM

## 2022-06-29 MED ORDER — AMPHETAMINE-DEXTROAMPHET ER 20 MG PO CP24: ORAL_CAPSULE | ORAL | 0 refills | Status: DC

## 2022-08-04 ENCOUNTER — Other Ambulatory Visit: Payer: Self-pay | Admitting: Internal Medicine

## 2022-08-04 DIAGNOSIS — F900 Attention-deficit hyperactivity disorder, predominantly inattentive type: Secondary | ICD-10-CM

## 2022-08-05 MED ORDER — AMPHETAMINE-DEXTROAMPHET ER 20 MG PO CP24: ORAL_CAPSULE | ORAL | 0 refills | Status: DC

## 2022-08-10 ENCOUNTER — Encounter: Payer: Self-pay | Admitting: Internal Medicine

## 2022-08-31 ENCOUNTER — Other Ambulatory Visit: Payer: Self-pay | Admitting: Internal Medicine

## 2022-08-31 DIAGNOSIS — F3342 Major depressive disorder, recurrent, in full remission: Secondary | ICD-10-CM

## 2022-08-31 DIAGNOSIS — F900 Attention-deficit hyperactivity disorder, predominantly inattentive type: Secondary | ICD-10-CM

## 2022-08-31 MED ORDER — AMPHETAMINE-DEXTROAMPHET ER 20 MG PO CP24: ORAL_CAPSULE | ORAL | 0 refills | Status: DC

## 2022-08-31 MED ORDER — SERTRALINE HCL 100 MG PO TABS: 100.0000 mg | ORAL_TABLET | Freq: Two times a day (BID) | ORAL | 0 refills | Status: DC

## 2022-09-04 ENCOUNTER — Ambulatory Visit (INDEPENDENT_AMBULATORY_CARE_PROVIDER_SITE_OTHER): Payer: Medicaid Other

## 2022-09-04 ENCOUNTER — Ambulatory Visit
Admission: RE | Admit: 2022-09-04 | Discharge: 2022-09-04 | Disposition: A | Discharge status: Home / Self Care | Payer: Medicaid Other | Source: Ambulatory Visit | Attending: Family Medicine | Admitting: Family Medicine

## 2022-09-04 VITALS — BP 128/80 | HR 77 | Temp 98.3°F | Resp 17

## 2022-09-04 DIAGNOSIS — M79674 Pain in right toe(s): Secondary | ICD-10-CM | POA: Diagnosis not present

## 2022-09-04 DIAGNOSIS — S92534A Nondisplaced fracture of distal phalanx of right lesser toe(s), initial encounter for closed fracture: Secondary | ICD-10-CM

## 2022-09-04 MED ORDER — IBUPROFEN 800 MG PO TABS: 800.0000 mg | ORAL_TABLET | Freq: Three times a day (TID) | ORAL | 0 refills | Status: DC

## 2022-09-04 NOTE — ED Provider Notes (Signed)
[facility]    CSN: [id number] Arrival date & time: 09/04/22  1907      History   Chief Complaint Chief Complaint  Patient presents with   Toe Injury    RT foot    HPI Kathy Stanley is a 47 y.o. female.   HPI  Patient states she accidentally stubbed her toe on a chair in her home yesterday.  The fourth toe on the right foot is swollen and painful.  Pain with weightbearing  History reviewed. No pertinent past medical history.  Patient Active Problem List   Diagnosis Date Noted   Pelvic pain in female 06/01/2022   ADHD (attention deficit hyperactivity disorder), inattentive type 05/31/2022   Recurrent major depressive disorder, in full remission 05/31/2022   Need for hepatitis C screening test 05/31/2022   Screening for HIV (human immunodeficiency virus) 05/31/2022   Abnormal weight gain 05/31/2022   Cervical cancer screening 05/31/2022   Encounter for general adult medical examination with abnormal findings 05/31/2022   Screen for colon cancer 05/31/2022   Cough productive of purulent sputum 07/27/2021    History reviewed. No pertinent surgical history.  OB History   No obstetric history on file.      Home Medications    Prior to Admission medications   Medication Sig Start Date End Date Taking? Authorizing Provider  ibuprofen (ADVIL) 800 MG tablet Take 1 tablet (800 mg total) by mouth 3 (three) times daily. 09/04/22  Yes Eustace Moore, MD  amphetamine-dextroamphetamine (ADDERALL XR) 20 MG 24 hr capsule Take 1 capsule by mouth every morning 08/31/22   Etta Grandchild, MD  sertraline (ZOLOFT) 100 MG tablet Take 1 tablet (100 mg total) by mouth in the morning and at bedtime. 08/31/22   Etta Grandchild, MD    Family History Family History  Problem Relation Age of Onset   Bipolar disorder Mother     Social History Social History   Tobacco Use   Smoking status: Never   Smokeless tobacco: Never  Vaping Use   Vaping Use: Never used  Substance  Use Topics   Alcohol use: Yes    Alcohol/week: 14.0 standard drinks of alcohol    Types: 14 Glasses of wine per week   Drug use: Never     Allergies   Septra [sulfamethoxazole-trimethoprim] and Sulfa antibiotics   Review of Systems Review of Systems See HPI  Physical Exam Triage Vital Signs ED Triage Vitals [09/04/22 1916]  Enc Vitals Group     BP 128/80     Pulse Rate 77     Resp 17     Temp 98.3 F (36.8 C)     Temp Source Oral     SpO2 97 %     Weight      Height      Head Circumference      Peak Flow      Pain Score      Pain Loc      Pain Edu?      Excl. in GC?    No data found.  Updated Vital Signs BP 128/80 (BP Location: Left Arm)   Pulse 77   Temp 98.3 F (36.8 C) (Oral)   Resp 17   LMP 08/19/2022 (Approximate)   SpO2 97%       Physical Exam Constitutional:      General: She is not in acute distress.    Appearance: She is well-developed.  HENT:     Head: Normocephalic and  atraumatic.  Eyes:     Conjunctiva/sclera: Conjunctivae normal.     Pupils: Pupils are equal, round, and reactive to light.  Cardiovascular:     Rate and Rhythm: Normal rate.  Pulmonary:     Effort: Pulmonary effort is normal. No respiratory distress.  Abdominal:     General: There is no distension.     Palpations: Abdomen is soft.  Musculoskeletal:        General: Normal range of motion.     Cervical back: Normal range of motion.       Feet:  Skin:    General: Skin is warm and dry.  Neurological:     Mental Status: She is alert.     Gait: Gait abnormal.      UC Treatments / Results  Labs (all labs ordered are listed, but only abnormal results are displayed) Labs Reviewed - No data to display  EKG   Radiology DG Foot Complete Right  Result Date: 09/04/2022 CLINICAL DATA:  Fourth toe injury 2 days ago with bruising and pain, initial encounter EXAM: RIGHT FOOT COMPLETE - 3+ VIEW COMPARISON:  None Available. FINDINGS: Undisplaced fracture at the base of  the fourth distal phalanx is seen. No other bony abnormality is noted. No significant soft tissue abnormality is seen. IMPRESSION: Undisplaced fracture at the base of the fourth distal phalanx. Electronically Signed   By: Alcide Clever M.D.   On: 09/04/2022 19:56    Procedures Procedures (including critical care time)  Medications Ordered in UC Medications - No data to display  Initial Impression / Assessment and Plan / UC Course  I have reviewed the triage vital signs and the nursing notes.  Pertinent labs & imaging results that were available during my care of the patient were reviewed by me and considered in my medical decision making (see chart for details).     Offered fracture shoe, buddy taping, ibuprofen for pain.  Ice and elevation to reduce pain and swelling. Final Clinical Impressions(s) / UC Diagnoses   Final diagnoses:  Toe pain, right  Closed nondisplaced fracture of distal phalanx of lesser toe of right foot, initial encounter     Discharge Instructions      Can buddy tape for support Wear flat post op shoe until improves Limit walking while painful Take ibuprofen 3 x a day with food See orthopedic if fails to improve     ED Prescriptions     Medication Sig Dispense Auth. Provider   ibuprofen (ADVIL) 800 MG tablet Take 1 tablet (800 mg total) by mouth 3 (three) times daily. 21 tablet Eustace Moore, MD      PDMP not reviewed this encounter.   Eustace Moore, MD 09/04/22 2002

## 2022-09-04 NOTE — Discharge Instructions (Signed)
Can buddy tape for support Wear flat post op shoe until improves Limit walking while painful Take ibuprofen 3 x a day with food See orthopedic if fails to improve

## 2022-09-04 NOTE — ED Triage Notes (Signed)
Pt c/o injury/pain to RT foot, 4th toe since Sunday. Says she stubbed it on a chair at home. Ice and elevation prn.

## 2022-10-06 ENCOUNTER — Other Ambulatory Visit: Payer: Self-pay | Admitting: Internal Medicine

## 2022-10-06 DIAGNOSIS — F900 Attention-deficit hyperactivity disorder, predominantly inattentive type: Secondary | ICD-10-CM

## 2022-10-06 MED ORDER — AMPHETAMINE-DEXTROAMPHET ER 20 MG PO CP24
ORAL_CAPSULE | ORAL | 0 refills | Status: DC
Start: 2022-10-06 — End: 2022-11-23

## 2022-11-13 ENCOUNTER — Other Ambulatory Visit: Payer: Self-pay | Admitting: Internal Medicine

## 2022-11-13 DIAGNOSIS — F900 Attention-deficit hyperactivity disorder, predominantly inattentive type: Secondary | ICD-10-CM

## 2022-11-23 ENCOUNTER — Other Ambulatory Visit: Payer: Self-pay | Admitting: Internal Medicine

## 2022-11-23 DIAGNOSIS — F900 Attention-deficit hyperactivity disorder, predominantly inattentive type: Secondary | ICD-10-CM

## 2022-11-24 MED ORDER — AMPHETAMINE-DEXTROAMPHET ER 20 MG PO CP24
ORAL_CAPSULE | ORAL | 0 refills | Status: DC
Start: 2022-11-24 — End: 2023-06-05

## 2023-01-12 ENCOUNTER — Other Ambulatory Visit: Payer: Self-pay | Admitting: Internal Medicine

## 2023-01-12 DIAGNOSIS — F3342 Major depressive disorder, recurrent, in full remission: Secondary | ICD-10-CM

## 2023-01-12 DIAGNOSIS — F900 Attention-deficit hyperactivity disorder, predominantly inattentive type: Secondary | ICD-10-CM

## 2023-01-17 DIAGNOSIS — F419 Anxiety disorder, unspecified: Secondary | ICD-10-CM | POA: Insufficient documentation

## 2023-01-31 IMAGING — DX DG CHEST 2V
2 series · 2 of 2 positions shown · non-contrast
Comparison: None.

CLINICAL DATA: Cough for 1 week, short of breath, congestion

EXAM:
CHEST - 2 VIEW

[chest pa]
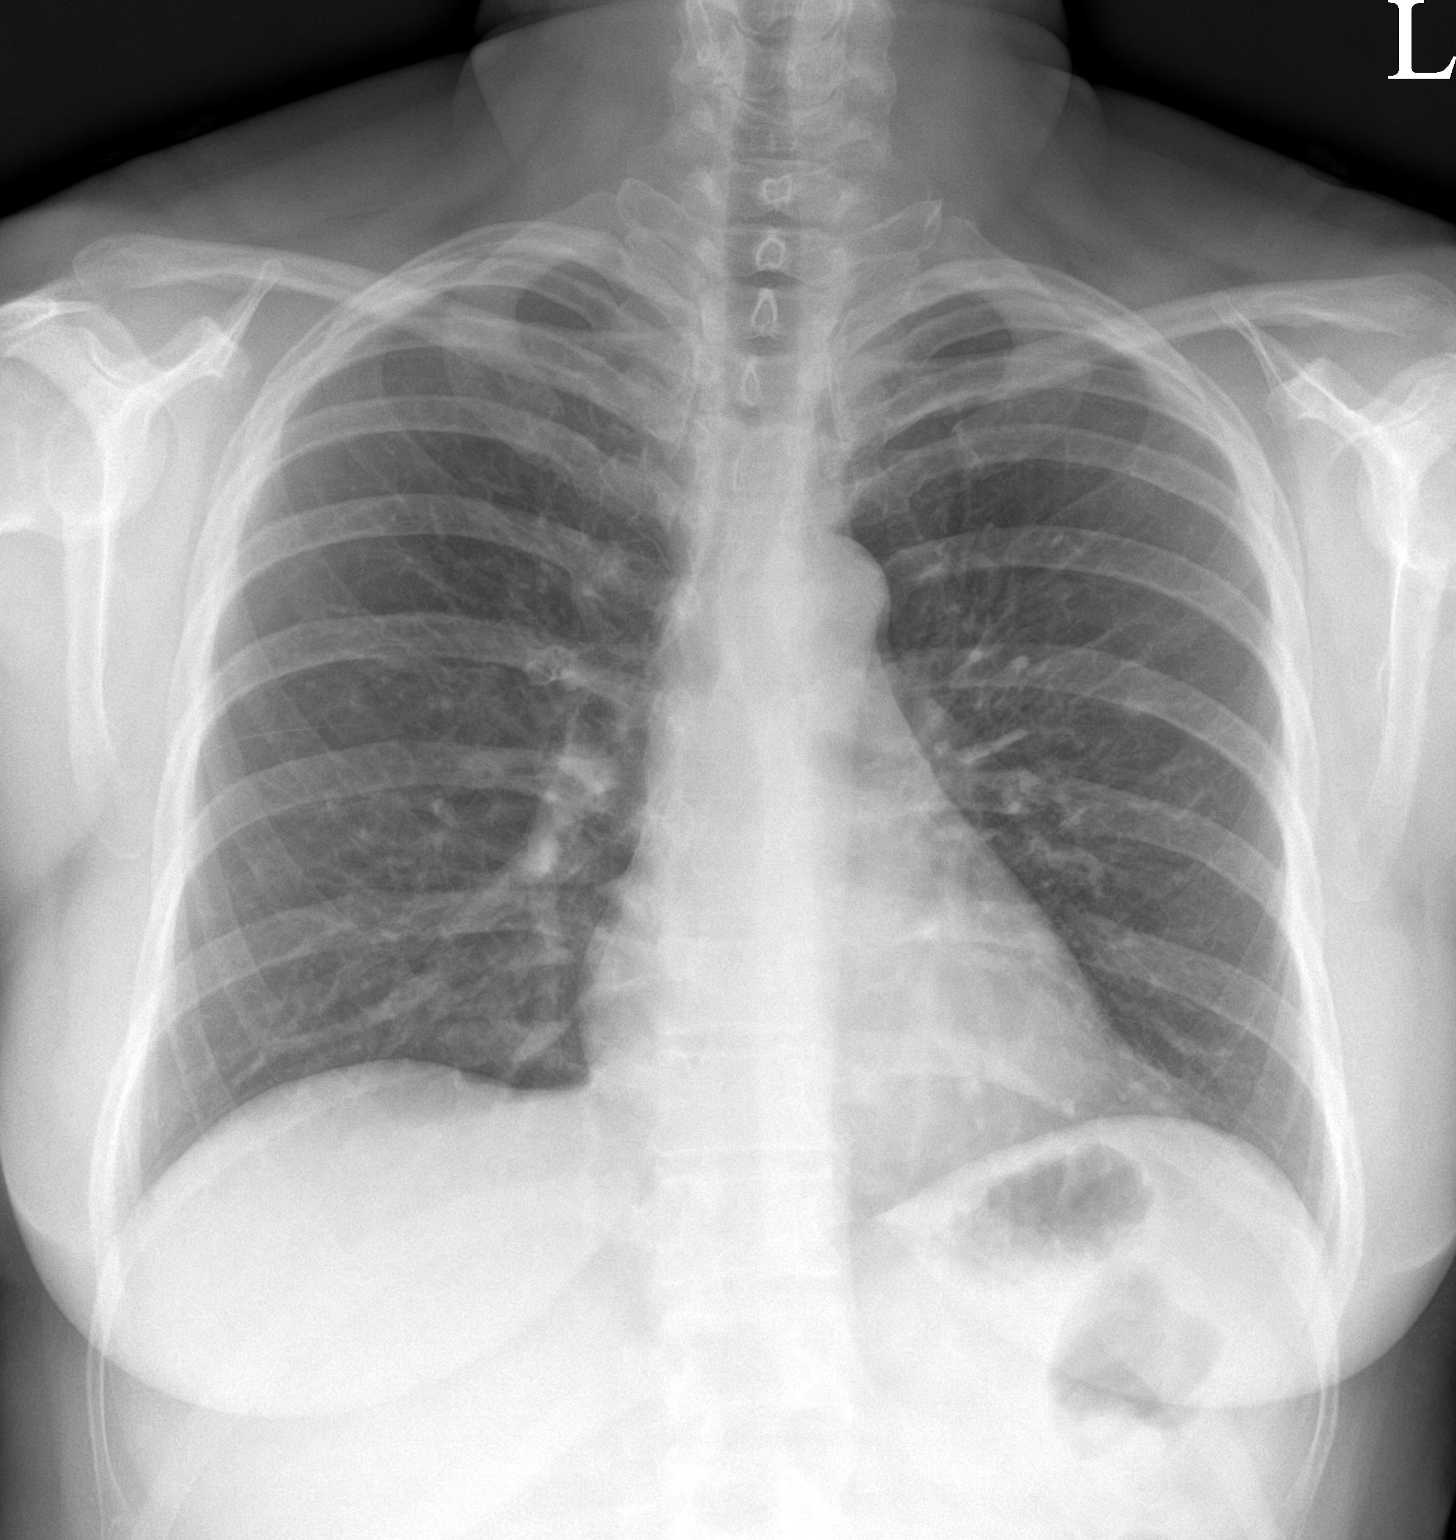

[chest lat]
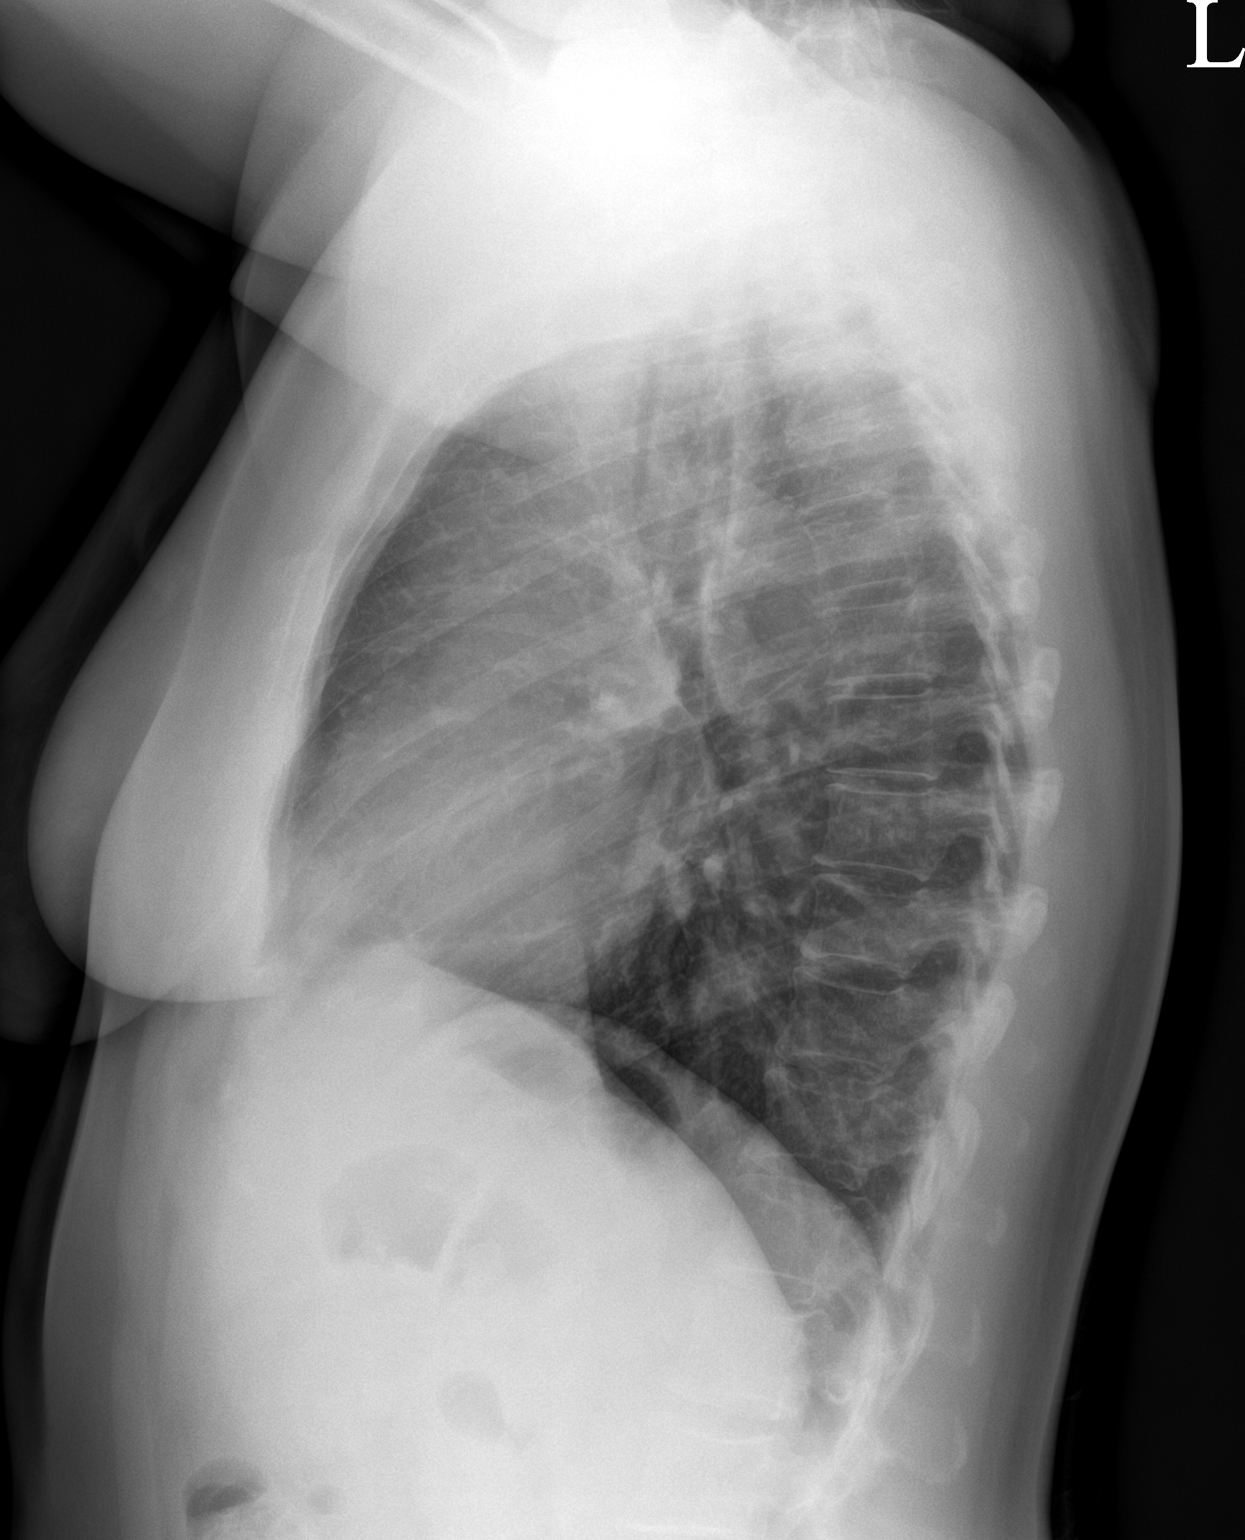

[2 of 2 positions shown; findings below may reference images not displayed]

FINDINGS: Frontal and lateral views of the chest demonstrate an unremarkable
cardiac silhouette. No acute airspace disease, effusion, or
pneumothorax. No acute bony abnormalities.
IMPRESSION: 1. No acute intrathoracic process.

## 2023-02-07 ENCOUNTER — Other Ambulatory Visit: Payer: Self-pay | Admitting: Internal Medicine

## 2023-02-07 DIAGNOSIS — F3342 Major depressive disorder, recurrent, in full remission: Secondary | ICD-10-CM

## 2023-02-07 DIAGNOSIS — F900 Attention-deficit hyperactivity disorder, predominantly inattentive type: Secondary | ICD-10-CM

## 2023-02-13 ENCOUNTER — Encounter: Payer: Self-pay | Admitting: Internal Medicine

## 2023-02-13 ENCOUNTER — Other Ambulatory Visit: Payer: Self-pay | Admitting: Internal Medicine

## 2023-02-13 DIAGNOSIS — F3342 Major depressive disorder, recurrent, in full remission: Secondary | ICD-10-CM

## 2023-02-13 MED ORDER — SERTRALINE HCL 100 MG PO TABS: 100.0000 mg | ORAL_TABLET | Freq: Two times a day (BID) | ORAL | 0 refills | Status: DC

## 2023-05-08 ENCOUNTER — Ambulatory Visit
Admission: RE | Admit: 2023-05-08 | Discharge: 2023-05-08 | Disposition: A | Discharge status: Home / Self Care | Payer: BC Managed Care – PPO | Source: Ambulatory Visit | Attending: Internal Medicine | Admitting: Internal Medicine

## 2023-05-08 VITALS — BP 130/84 | HR 76 | Temp 98.3°F | Resp 18

## 2023-05-08 DIAGNOSIS — R051 Acute cough: Secondary | ICD-10-CM | POA: Diagnosis not present

## 2023-05-08 DIAGNOSIS — B349 Viral infection, unspecified: Secondary | ICD-10-CM | POA: Diagnosis not present

## 2023-05-08 DIAGNOSIS — F3342 Major depressive disorder, recurrent, in full remission: Secondary | ICD-10-CM | POA: Diagnosis not present

## 2023-05-08 DIAGNOSIS — Z760 Encounter for issue of repeat prescription: Secondary | ICD-10-CM | POA: Diagnosis not present

## 2023-05-08 LAB — POC COVID19/FLU A&B COMBO
Covid Antigen, POC: NEGATIVE
Influenza A Antigen, POC: NEGATIVE
Influenza B Antigen, POC: NEGATIVE

## 2023-05-08 MED ORDER — SERTRALINE HCL 100 MG PO TABS
200.0000 mg | ORAL_TABLET | Freq: Every day | ORAL | 0 refills | Status: DC
Start: 2023-05-08 — End: 2024-03-04

## 2023-05-08 MED ORDER — PROMETHAZINE-DM 6.25-15 MG/5ML PO SYRP
5.0000 mL | ORAL_SOLUTION | Freq: Four times a day (QID) | ORAL | 0 refills | Status: DC | PRN
Start: 2023-05-08 — End: 2023-06-05

## 2023-05-08 NOTE — Discharge Instructions (Signed)
May take promethazine as needed for cough.  Presents medication to make her drowsy.  Lots of rest and fluids.  I refilled the your sertraline for 30 days.  Additional refills will need to go with your primary care.  Please follow-up with your primary care if your symptoms do not improve.  Please go to emergency room if you develop any worsening symptoms.  Hope you feel better soon!

## 2023-05-08 NOTE — ED Triage Notes (Signed)
Pt presents with c/o cold sxs. Pt states she has a sore throat, swollen glands, nasal congestion and cough X 4 days.   Home interventions: tylenol cold and flu    Pt states this morning she felt her cheeks on fire, unsure of fever.

## 2023-05-08 NOTE — ED Provider Notes (Signed)
[facility]    CSN: [id number] Arrival date & time: 05/08/23  1605      History   Chief Complaint Chief Complaint  Patient presents with   Cough    Cold/flu symptoms began Friday 05/04/23, sore throat, muscle aches. Need a flu and covid test before I can return to work because I'm afraid it's contagious as my daughter is sick as well. - Entered by patient    HPI Edit Kathy Stanley is a 47 y.o. female  presents for evaluation of URI symptoms for 4 days. Patient reports associated symptoms of cough, congestion, intermittent sore throat, swollen glands. Denies N/V/D, fevers, ear pain, body aches, shortness of breath.  Does state episodes of feeling warm.  Patient does not have a hx of asthma. Patient does not have a history of smoking.  Reports daughter is ill and she was exposed to flu via work [employer].  Pt has taken Tylenol Cold and flu OTC for symptoms. Pt has no other concerns at this time.    Cough Associated symptoms: sore throat     History reviewed. No pertinent past medical history.  Patient Active Problem List   Diagnosis Date Noted   Pelvic pain in female 06/01/2022   ADHD (attention deficit hyperactivity disorder), inattentive type 05/31/2022   Recurrent major depressive disorder, in full remission (HCC) 05/31/2022   Need for hepatitis C screening test 05/31/2022   Screening for HIV (human immunodeficiency virus) 05/31/2022   Abnormal weight gain 05/31/2022   Cervical cancer screening 05/31/2022   Encounter for general adult medical examination with abnormal findings 05/31/2022   Screen for colon cancer 05/31/2022   Cough productive of purulent sputum 07/27/2021    History reviewed. No pertinent surgical history.  OB History   No obstetric history on file.      Home Medications    Prior to Admission medications   Medication Sig Start Date End Date Taking? Authorizing Provider  promethazine-dextromethorphan (PROMETHAZINE-DM) 6.25-15 MG/5ML syrup  Take 5 mLs by mouth 4 (four) times daily as needed for cough. 05/08/23  Yes Radford Pax, NP  amphetamine-dextroamphetamine (ADDERALL XR) 20 MG 24 hr capsule Take 1 capsule by mouth every morning 11/24/22   Etta Grandchild, MD  sertraline (ZOLOFT) 100 MG tablet Take 2 tablets (200 mg total) by mouth daily. 05/08/23   Radford Pax, NP    Family History Family History  Problem Relation Age of Onset   Bipolar disorder Mother     Social History Social History   Tobacco Use   Smoking status: Never   Smokeless tobacco: Never  Vaping Use   Vaping status: Never Used  Substance Use Topics   Alcohol use: Yes    Alcohol/week: 14.0 standard drinks of alcohol    Types: 14 Glasses of wine per week   Drug use: Never     Allergies   Septra [sulfamethoxazole-trimethoprim] and Sulfa antibiotics   Review of Systems Review of Systems  HENT:  Positive for congestion and sore throat.   Respiratory:  Positive for cough.      Physical Exam Triage Vital Signs ED Triage Vitals  Encounter Vitals Group     BP 05/08/23 1614 130/84     Systolic BP Percentile --      Diastolic BP Percentile --      Pulse Rate 05/08/23 1614 76     Resp 05/08/23 1614 18     Temp 05/08/23 1614 98.3 F (36.8 C)     Temp Source 05/08/23  1614 Oral     SpO2 05/08/23 1614 97 %     Weight --      Height --      Head Circumference --      Peak Flow --      Pain Score 05/08/23 1613 5     Pain Loc --      Pain Education --      Exclude from Growth Chart --    No data found.  Updated Vital Signs BP 130/84 (BP Location: Right Arm)   Pulse 76   Temp 98.3 F (36.8 C) (Oral)   Resp 18   LMP 04/17/2023 (Exact Date)   SpO2 97%   Visual Acuity Right Eye Distance:   Left Eye Distance:   Bilateral Distance:    Right Eye Near:   Left Eye Near:    Bilateral Near:     Physical Exam Vitals and nursing note reviewed.  Constitutional:      General: She is not in acute distress.    Appearance: She is  well-developed. She is not ill-appearing.  HENT:     Head: Normocephalic and atraumatic.     Right Ear: Tympanic membrane and ear canal normal.     Left Ear: Tympanic membrane and ear canal normal.     Nose: Congestion present.     Mouth/Throat:     Mouth: Mucous membranes are moist.     Pharynx: Oropharynx is clear. Uvula midline. No oropharyngeal exudate or posterior oropharyngeal erythema.     Tonsils: No tonsillar exudate or tonsillar abscesses.  Eyes:     Conjunctiva/sclera: Conjunctivae normal.     Pupils: Pupils are equal, round, and reactive to light.  Cardiovascular:     Rate and Rhythm: Normal rate and regular rhythm.     Heart sounds: Normal heart sounds.  Pulmonary:     Effort: Pulmonary effort is normal.     Breath sounds: Normal breath sounds.  Musculoskeletal:     Cervical back: Normal range of motion and neck supple.  Lymphadenopathy:     Cervical: No cervical adenopathy.  Skin:    General: Skin is warm and dry.  Neurological:     General: No focal deficit present.     Mental Status: She is alert and oriented to person, place, and time.  Psychiatric:        Mood and Affect: Mood normal.        Behavior: Behavior normal.      UC Treatments / Results  Labs (all labs ordered are listed, but only abnormal results are displayed) Labs Reviewed  POC COVID19/FLU A&B COMBO    EKG   Radiology No results found.  Procedures Procedures (including critical care time)  Medications Ordered in UC Medications - No data to display  Initial Impression / Assessment and Plan / UC Course  I have reviewed the triage vital signs and the nursing notes.  Pertinent labs & imaging results that were available during my care of the patient were reviewed by me and considered in my medical decision making (see chart for details).      Reviewed exam and symptoms with patient.  No red flags.  Negative rapid flu and COVID testing.  Discussed viral illness and symptomatic  treatment.  Promethazine DM as needed for cough, side effect profile reviewed.  Patient requested refill of her sertraline as she was unable to get in with her PCP.  30-day refill provided and patient directed additional refills need to go  with her PCP and she verbalized understanding.  PCP follow-up as symptoms do not improve.  ER precautions reviewed. Final Clinical Impressions(s) / UC Diagnoses   Final diagnoses:  Acute cough  Viral illness  Encounter for medication refill     Discharge Instructions      May take promethazine as needed for cough.  Presents medication to make her drowsy.  Lots of rest and fluids.  I refilled the your sertraline for 30 days.  Additional refills will need to go with your primary care.  Please follow-up with your primary care if your symptoms do not improve.  Please go to emergency room if you develop any worsening symptoms.  Hope you feel better soon!     ED Prescriptions     Medication Sig Dispense Auth. Provider   promethazine-dextromethorphan (PROMETHAZINE-DM) 6.25-15 MG/5ML syrup Take 5 mLs by mouth 4 (four) times daily as needed for cough. 118 mL Radford Pax, NP   sertraline (ZOLOFT) 100 MG tablet Take 2 tablets (200 mg total) by mouth daily. 60 tablet Radford Pax, NP      PDMP not reviewed this encounter.   Radford Pax, NP 05/08/23 [phone]

## 2023-05-28 ENCOUNTER — Ambulatory Visit: Payer: BC Managed Care – PPO | Admitting: Internal Medicine

## 2023-06-05 ENCOUNTER — Ambulatory Visit: Payer: BC Managed Care – PPO | Admitting: Internal Medicine

## 2023-06-05 ENCOUNTER — Encounter: Payer: Self-pay | Admitting: Internal Medicine

## 2023-06-05 VITALS — BP 110/86 | HR 80 | Temp 98.2°F | Resp 16 | Ht 60.0 in | Wt 156.0 lb

## 2023-06-05 DIAGNOSIS — Z0001 Encounter for general adult medical examination with abnormal findings: Secondary | ICD-10-CM

## 2023-06-05 DIAGNOSIS — E6609 Other obesity due to excess calories: Secondary | ICD-10-CM | POA: Diagnosis not present

## 2023-06-05 DIAGNOSIS — Z23 Encounter for immunization: Secondary | ICD-10-CM

## 2023-06-05 DIAGNOSIS — Z124 Encounter for screening for malignant neoplasm of cervix: Secondary | ICD-10-CM

## 2023-06-05 DIAGNOSIS — Z0000 Encounter for general adult medical examination without abnormal findings: Secondary | ICD-10-CM

## 2023-06-05 DIAGNOSIS — Z1231 Encounter for screening mammogram for malignant neoplasm of breast: Secondary | ICD-10-CM | POA: Insufficient documentation

## 2023-06-05 DIAGNOSIS — Z6830 Body mass index (BMI) 30.0-30.9, adult: Secondary | ICD-10-CM | POA: Diagnosis not present

## 2023-06-05 DIAGNOSIS — F3342 Major depressive disorder, recurrent, in full remission: Secondary | ICD-10-CM

## 2023-06-05 DIAGNOSIS — F900 Attention-deficit hyperactivity disorder, predominantly inattentive type: Secondary | ICD-10-CM

## 2023-06-05 DIAGNOSIS — E66811 Obesity, class 1: Secondary | ICD-10-CM

## 2023-06-05 DIAGNOSIS — Z1211 Encounter for screening for malignant neoplasm of colon: Secondary | ICD-10-CM | POA: Insufficient documentation

## 2023-06-05 LAB — BASIC METABOLIC PANEL
BUN: 13 mg/dL (ref 6–23)
CO2: 29 meq/L (ref 19–32)
Calcium: 8.9 mg/dL (ref 8.4–10.5)
Chloride: 101 meq/L (ref 96–112)
Creatinine, Ser: 0.66 mg/dL (ref 0.40–1.20)
GFR: 104.39 mL/min (ref >60.00)
Glucose, Bld: 83 mg/dL (ref 70–99)
Potassium: 4.4 meq/L (ref 3.5–5.1)
Sodium: 135 meq/L (ref 135–145)

## 2023-06-05 LAB — CBC WITH DIFFERENTIAL/PLATELET
Basophils Absolute: 0.1 10*3/uL (ref 0.0–0.1)
Basophils Relative: 1.2 % (ref 0.0–3.0)
Eosinophils Absolute: 0.1 10*3/uL (ref 0.0–0.7)
Eosinophils Relative: 1.9 % (ref 0.0–5.0)
HCT: 38.9 % (ref 36.0–46.0)
Hemoglobin: 12.9 g/dL (ref 12.0–15.0)
Lymphocytes Relative: 28.9 % (ref 12.0–46.0)
Lymphs Abs: 2 10*3/uL (ref 0.7–4.0)
MCHC: 33.1 g/dL (ref 30.0–36.0)
MCV: 93.2 fL (ref 78.0–100.0)
Monocytes Absolute: 0.6 10*3/uL (ref 0.1–1.0)
Monocytes Relative: 7.9 % (ref 3.0–12.0)
Neutro Abs: 4.2 10*3/uL (ref 1.4–7.7)
Neutrophils Relative %: 60.1 % (ref 43.0–77.0)
Platelets: 349 10*3/uL (ref 150.0–400.0)
RBC: 4.17 Mil/uL (ref 3.87–5.11)
RDW: 13.3 % (ref 11.5–15.5)
WBC: 7 10*3/uL (ref 4.0–10.5)

## 2023-06-05 LAB — HEPATIC FUNCTION PANEL
ALT: 10 U/L (ref 0–35)
AST: 20 U/L (ref 0–37)
Albumin: 4.4 g/dL (ref 3.5–5.2)
Alkaline Phosphatase: 38 U/L — ABNORMAL LOW (ref 39–117)
Bilirubin, Direct: 0.1 mg/dL (ref 0.0–0.3)
Total Bilirubin: 0.3 mg/dL (ref 0.2–1.2)
Total Protein: 7.3 g/dL (ref 6.0–8.3)

## 2023-06-05 LAB — HEMOGLOBIN A1C: Hgb A1c MFr Bld: 5.3 % (ref 4.6–6.5)

## 2023-06-05 LAB — TSH: TSH: 1.55 u[IU]/mL (ref 0.35–5.50)

## 2023-06-05 MED ORDER — SEMAGLUTIDE-WEIGHT MANAGEMENT 1 MG/0.5ML ~~LOC~~ SOAJ
1.0000 mg | SUBCUTANEOUS | 0 refills | Status: AC
Start: 2023-08-02 — End: 2023-08-30

## 2023-06-05 MED ORDER — SEMAGLUTIDE-WEIGHT MANAGEMENT 1.7 MG/0.75ML ~~LOC~~ SOAJ
1.7000 mg | SUBCUTANEOUS | 0 refills | Status: DC
Start: 2023-08-31 — End: 2023-11-05

## 2023-06-05 MED ORDER — SEMAGLUTIDE-WEIGHT MANAGEMENT 0.25 MG/0.5ML ~~LOC~~ SOAJ
0.2500 mg | SUBCUTANEOUS | 0 refills | Status: AC
Start: 2023-06-05 — End: 2023-07-03

## 2023-06-05 MED ORDER — SEMAGLUTIDE-WEIGHT MANAGEMENT 2.4 MG/0.75ML ~~LOC~~ SOAJ
2.4000 mg | SUBCUTANEOUS | 0 refills | Status: AC
Start: 2023-09-29 — End: 2023-10-27

## 2023-06-05 MED ORDER — SEMAGLUTIDE-WEIGHT MANAGEMENT 0.5 MG/0.5ML ~~LOC~~ SOAJ
0.5000 mg | SUBCUTANEOUS | 0 refills | Status: AC
Start: 2023-07-04 — End: 2023-08-01

## 2023-06-05 MED ORDER — AMPHETAMINE-DEXTROAMPHET ER 20 MG PO CP24
ORAL_CAPSULE | ORAL | 0 refills | Status: DC
Start: 2023-06-05 — End: 2023-07-10

## 2023-06-05 NOTE — Progress Notes (Signed)
 Subjective:  Patient ID: Kathy Stanley, female    DOB: 15-Oct-1975  Age: 48 y.o. MRN: [id number]  CC: Annual Exam   HPI Momina Hunton presents for a CPX and f/up ---   Discussed the use of AI scribe software for clinical note transcription with the patient, who gave verbal consent to proceed.  History of Present Illness   The patient, with a history of chronic sinusitis and depression, presents with a persistent runny nose and mucus production, which she attributes to a recent viral infection. She reports feeling better overall, with no associated fever, chills, wheezing, or chest pain. She has been managing her symptoms with Zyrtec D, which she reports as helpful.  The patient also reports intermittent adherence to her sertraline  regimen, which she attributes to her recent move and the associated stress. She notes that when she misses doses, she feels irritable and 'off,' but these symptoms are not severe. She has been trying to take her medication at night due to its sedative effects.  In addition to her sinus and mental health concerns, the patient reports a recent change in her vision, specifically difficulty reading small print. She has started using reading glasses to manage this issue.  The patient also expresses frustration with her weight, reporting that despite dietary changes and eating small meals, her weight fluctuates without significant overall loss. She expresses interest in weight loss medication.  Lastly, the patient reports regular menstrual cycles and no chance of pregnancy, as her partner has had a vasectomy. She denies any vaginal discharge or bleeding. She has not had recent Pap smears, mammograms, or colon cancer screening, and expresses a preference for at-home colon cancer screening with Cologuard.       Outpatient Medications Prior to Visit  Medication Sig Dispense Refill   sertraline  (ZOLOFT ) 100 MG tablet Take 2 tablets (200 mg total) by mouth daily. 60 tablet 0    amphetamine -dextroamphetamine (ADDERALL XR) 20 MG 24 hr capsule Take 1 capsule by mouth every morning (Patient not taking: Reported on 06/05/2023) 30 capsule 0   promethazine -dextromethorphan (PROMETHAZINE -DM) 6.25-15 MG/5ML syrup Take 5 mLs by mouth 4 (four) times daily as needed for cough. 118 mL 0   No facility-administered medications prior to visit.    ROS Review of Systems  Constitutional: Negative.  Negative for appetite change, chills, diaphoresis and fatigue.  HENT: Negative.    Eyes: Negative.   Respiratory: Negative.  Negative for cough, chest tightness, shortness of breath and wheezing.   Cardiovascular:  Negative for chest pain, palpitations and leg swelling.  Gastrointestinal:  Negative for abdominal pain, diarrhea, nausea and vomiting.  Genitourinary: Negative.  Negative for difficulty urinating and dysuria.  Musculoskeletal: Negative.  Negative for arthralgias and myalgias.  Skin: Negative.   Neurological: Negative.  Negative for dizziness and weakness.  Psychiatric/Behavioral:  Positive for decreased concentration. Negative for confusion, dysphoric mood and sleep disturbance. The patient is not nervous/anxious.     Objective:  BP 110/86 (BP Location: Left Arm, Patient Position: Sitting, Cuff Size: Normal)   Pulse 80   Temp 98.2 F (36.8 C) (Oral)   Resp 16   Ht 5' (1.524 m)   Wt 156 lb (70.8 kg)   LMP 05/18/2023 (Exact Date)   SpO2 96%   BMI 30.47 kg/m   BP Readings from Last 3 Encounters:  06/05/23 110/86  05/08/23 130/84  09/04/22 128/80    Wt Readings from Last 3 Encounters:  06/05/23 156 lb (70.8 kg)  05/31/22 169 lb (76.7 kg)  07/27/21 159 lb (72.1 kg)    Physical Exam Vitals reviewed.  Constitutional:      Appearance: Normal appearance.  HENT:     Nose: Nose normal.     Mouth/Throat:     Mouth: Mucous membranes are moist.  Eyes:     General: No scleral icterus.    Conjunctiva/sclera: Conjunctivae normal.  Cardiovascular:     Rate and  Rhythm: Normal rate and regular rhythm.     Heart sounds: No murmur heard.    No friction rub. No gallop.  Pulmonary:     Effort: Pulmonary effort is normal.     Breath sounds: No stridor. No wheezing, rhonchi or rales.  Abdominal:     General: Abdomen is flat.     Palpations: There is no mass.     Tenderness: There is no abdominal tenderness. There is no guarding.     Hernia: No hernia is present.  Musculoskeletal:        General: Normal range of motion.     Cervical back: Neck supple.     Right lower leg: No edema.     Left lower leg: No edema.  Lymphadenopathy:     Cervical: No cervical adenopathy.  Skin:    General: Skin is warm and dry.  Neurological:     General: No focal deficit present.     Mental Status: She is alert and oriented to person, place, and time. Mental status is at baseline.  Psychiatric:        Mood and Affect: Mood normal.        Behavior: Behavior normal.        Thought Content: Thought content normal.        Judgment: Judgment normal.     Lab Results  Component Value Date   WBC 7.0 06/05/2023   HGB 12.9 06/05/2023   HCT 38.9 06/05/2023   PLT 349.0 06/05/2023   GLUCOSE 83 06/05/2023   CHOL 215 (H) 05/31/2022   TRIG 87.0 05/31/2022   HDL 49.10 05/31/2022   LDLCALC 148 (H) 05/31/2022   ALT 10 06/05/2023   AST 20 06/05/2023   NA 135 06/05/2023   K 4.4 06/05/2023   CL 101 06/05/2023   CREATININE 0.66 06/05/2023   BUN 13 06/05/2023   CO2 29 06/05/2023   TSH 1.55 06/05/2023   HGBA1C 5.3 06/05/2023    No results found.  Assessment & Plan:  Screening for colon cancer -     Cologuard  Screening mammogram for breast cancer -     3D Screening Mammogram, Left and Right; Future  Cervical cancer screening -     Ambulatory referral to Gynecology  Class 1 obesity due to excess calories with serious comorbidity and body mass index (BMI) of 30.0 to 30.9 in adult -     CBC with Differential/Platelet; Future -     Hepatic function panel;  Future -     Hemoglobin A1c; Future -     TSH; Future -     Basic metabolic panel; Future -     Semaglutide -Weight Management; Inject 0.25 mg into the skin once a week for 28 days.  Dispense: 2 mL; Refill: 0 -     Semaglutide -Weight Management; Inject 0.5 mg into the skin once a week for 28 days.  Dispense: 2 mL; Refill: 0 -     Semaglutide -Weight Management; Inject 1 mg into the skin once a week for 28 days.  Dispense: 2 mL; Refill: 0 -  Semaglutide -Weight Management; Inject 1.7 mg into the skin once a week for 28 days.  Dispense: 3 mL; Refill: 0 -     Semaglutide -Weight Management; Inject 2.4 mg into the skin once a week for 28 days.  Dispense: 3 mL; Refill: 0  ADHD (attention deficit hyperactivity disorder), inattentive type -     CBC with Differential/Platelet; Future -     Hepatic function panel; Future -     Hemoglobin A1c; Future -     TSH; Future -     Basic metabolic panel; Future -     Amphetamine -Dextroamphet ER; Take 1 capsule by mouth every morning  Dispense: 30 capsule; Refill: 0  Encounter for general adult medical examination with abnormal findings - Exam completed, labs reviewed, vaccines reviewed and updated, cancer screenings addressed, pt ed material was given.   Recurrent major depressive disorder, in full remission (HCC) -     CBC with Differential/Platelet; Future -     Hepatic function panel; Future -     TSH; Future -     Basic metabolic panel; Future  Need for diphtheria-tetanus-pertussis (Tdap) vaccine -     Tdap vaccine greater than or equal to 7yo IM     Follow-up: Return in about 6 months (around 12/03/2023).  Debby Molt, MD

## 2023-06-05 NOTE — Patient Instructions (Signed)

## 2023-06-18 ENCOUNTER — Ambulatory Visit: Payer: BC Managed Care – PPO | Admitting: Internal Medicine

## 2023-06-21 ENCOUNTER — Ambulatory Visit: Payer: BC Managed Care – PPO | Admitting: Internal Medicine

## 2023-06-25 ENCOUNTER — Telehealth: Payer: Self-pay | Admitting: Pharmacist

## 2023-06-25 NOTE — Telephone Encounter (Signed)
Pharmacy Patient Advocate Encounter   Received notification from Physician's Office that prior authorization for Wegovy 0.25MG /0.5ML auto-injectors is required/requested.   Insurance verification completed.   The patient is insured through [facility] .   Per test claim: PA required; PA submitted to above mentioned insurance via CoverMyMeds Key/confirmation #/EOC [id number] Status is pending

## 2023-06-26 ENCOUNTER — Other Ambulatory Visit (HOSPITAL_COMMUNITY): Payer: Self-pay

## 2023-06-26 NOTE — Telephone Encounter (Signed)
Patient has been notified

## 2023-06-26 NOTE — Telephone Encounter (Signed)
Pharmacy Patient Advocate Encounter  Received notification from [facility] that Prior Authorization for [facility] 0.25MG /0.5ML auto-injectors has been APPROVED from 06/25/2023 to 10/29/2023. Ran test claim, Copay is $24.99. This test claim was processed through [facility]- copay amounts may vary at other pharmacies due to pharmacy/plan contracts, or as the patient moves through the different stages of their insurance plan.   PA #/Case ID/Reference #:  [id number]

## 2023-07-02 ENCOUNTER — Ambulatory Visit: Payer: BC Managed Care – PPO | Admitting: Internal Medicine

## 2023-07-10 ENCOUNTER — Other Ambulatory Visit: Payer: Self-pay | Admitting: Internal Medicine

## 2023-07-10 DIAGNOSIS — F900 Attention-deficit hyperactivity disorder, predominantly inattentive type: Secondary | ICD-10-CM

## 2023-07-13 MED ORDER — AMPHETAMINE-DEXTROAMPHET ER 20 MG PO CP24
ORAL_CAPSULE | ORAL | 0 refills | Status: DC
Start: 2023-07-13 — End: 2023-08-20

## 2023-08-03 ENCOUNTER — Telehealth: Payer: Self-pay

## 2023-08-20 ENCOUNTER — Other Ambulatory Visit: Payer: Self-pay | Admitting: Internal Medicine

## 2023-08-20 DIAGNOSIS — F900 Attention-deficit hyperactivity disorder, predominantly inattentive type: Secondary | ICD-10-CM

## 2023-08-20 MED ORDER — AMPHETAMINE-DEXTROAMPHET ER 20 MG PO CP24
ORAL_CAPSULE | ORAL | 0 refills | Status: DC
Start: 2023-08-20 — End: 2023-10-08

## 2023-10-01 DIAGNOSIS — F17210 Nicotine dependence, cigarettes, uncomplicated: Secondary | ICD-10-CM | POA: Diagnosis not present

## 2023-10-01 DIAGNOSIS — Z720 Tobacco use: Secondary | ICD-10-CM | POA: Diagnosis not present

## 2023-10-08 ENCOUNTER — Other Ambulatory Visit: Payer: Self-pay | Admitting: Internal Medicine

## 2023-10-08 DIAGNOSIS — F900 Attention-deficit hyperactivity disorder, predominantly inattentive type: Secondary | ICD-10-CM

## 2023-10-08 MED ORDER — AMPHETAMINE-DEXTROAMPHET ER 20 MG PO CP24
ORAL_CAPSULE | ORAL | 0 refills | Status: DC
Start: 2023-10-08 — End: 2023-11-19

## 2023-11-02 ENCOUNTER — Other Ambulatory Visit: Payer: Self-pay | Admitting: Internal Medicine

## 2023-11-02 DIAGNOSIS — E6609 Other obesity due to excess calories: Secondary | ICD-10-CM

## 2023-11-05 ENCOUNTER — Telehealth: Payer: Self-pay

## 2023-11-05 ENCOUNTER — Other Ambulatory Visit (HOSPITAL_COMMUNITY): Payer: Self-pay

## 2023-11-05 NOTE — Telephone Encounter (Signed)
 Pharmacy Patient Advocate Encounter   Received notification from Patient Pharmacy that prior authorization for Wegovy  1.7 is required/requested.   Insurance verification completed.   The patient is insured through [facility] .   Per test claim: PA required; PA submitted to above mentioned insurance via CoverMyMeds Key/confirmation #/EOC [id number] Status is pending

## 2023-11-06 ENCOUNTER — Telehealth: Payer: Self-pay | Admitting: Internal Medicine

## 2023-11-06 NOTE — Telephone Encounter (Signed)
 Copied from CRM [id number]. Topic: Clinical - Medication Prior Auth >> Nov 06, 2023 10:01 AM Dimple Francis wrote: Reason for CRM: [place]- jan 7th 2025- Needs to get an updated weight. [phone] option 3-4-2 for missing clinical line . Need information for prior authorization

## 2023-11-07 NOTE — Telephone Encounter (Signed)
 Patient overdue for F/U, no recent weight since January.

## 2023-11-09 NOTE — Telephone Encounter (Signed)
 Unable to reach patient. If she calls back please ask her her most recent weight.

## 2023-11-13 NOTE — Telephone Encounter (Signed)
 Copied from CRM [id number]. Topic: Clinical - Medication Prior Auth >> Nov 13, 2023  2:00 PM Howard Macho wrote: Jakena from [facility] called stating they need a current body weight for prior authorization for wegovy  for the patient CB [phone] option 3 then 4 then 2

## 2023-11-14 NOTE — Telephone Encounter (Signed)
 Attempted to reach patient for updated weight

## 2023-11-16 NOTE — Telephone Encounter (Signed)
 Pharmacy Patient Advocate Encounter  Received notification from [facility] that Prior Authorization for Wegovy  1.7 has been DENIED.  No reason given; No denial letter received via Fax or CMM. It has been requested and will be uploaded to the media tab once received.   PA #/Case ID/Reference #: [id number]

## 2023-11-19 ENCOUNTER — Encounter: Payer: Self-pay | Admitting: Internal Medicine

## 2023-11-19 ENCOUNTER — Ambulatory Visit: Admitting: Internal Medicine

## 2023-11-19 VITALS — BP 122/80 | HR 85 | Temp 98.4°F | Resp 16 | Ht 60.0 in | Wt 145.0 lb

## 2023-11-19 DIAGNOSIS — E66811 Obesity, class 1: Secondary | ICD-10-CM | POA: Insufficient documentation

## 2023-11-19 DIAGNOSIS — E6609 Other obesity due to excess calories: Secondary | ICD-10-CM

## 2023-11-19 DIAGNOSIS — Z124 Encounter for screening for malignant neoplasm of cervix: Secondary | ICD-10-CM

## 2023-11-19 DIAGNOSIS — J069 Acute upper respiratory infection, unspecified: Secondary | ICD-10-CM | POA: Diagnosis not present

## 2023-11-19 DIAGNOSIS — F411 Generalized anxiety disorder: Secondary | ICD-10-CM | POA: Insufficient documentation

## 2023-11-19 DIAGNOSIS — F900 Attention-deficit hyperactivity disorder, predominantly inattentive type: Secondary | ICD-10-CM

## 2023-11-19 DIAGNOSIS — Z0000 Encounter for general adult medical examination without abnormal findings: Secondary | ICD-10-CM | POA: Insufficient documentation

## 2023-11-19 DIAGNOSIS — Z1231 Encounter for screening mammogram for malignant neoplasm of breast: Secondary | ICD-10-CM

## 2023-11-19 DIAGNOSIS — E663 Overweight: Secondary | ICD-10-CM | POA: Insufficient documentation

## 2023-11-19 DIAGNOSIS — Z6830 Body mass index (BMI) 30.0-30.9, adult: Secondary | ICD-10-CM

## 2023-11-19 DIAGNOSIS — Z1211 Encounter for screening for malignant neoplasm of colon: Secondary | ICD-10-CM

## 2023-11-19 LAB — POC COVID19 BINAXNOW: SARS Coronavirus 2 Ag: NEGATIVE

## 2023-11-19 MED ORDER — WEGOVY 1.7 MG/0.75ML ~~LOC~~ SOAJ: 1.7000 mg | SUBCUTANEOUS | 1 refills | Status: DC

## 2023-11-19 MED ORDER — AMPHETAMINE-DEXTROAMPHET ER 20 MG PO CP24
ORAL_CAPSULE | ORAL | 0 refills | Status: DC
Start: 2023-11-19 — End: 2024-01-04

## 2023-11-19 NOTE — Patient Instructions (Signed)
 Viral Respiratory Infection A respiratory infection is an illness that affects part of the respiratory system, such as the lungs, nose, or throat. A respiratory infection that is caused by a virus is called a viral respiratory infection. Common types of viral respiratory infections include: A cold. The flu (influenza). A respiratory syncytial virus (RSV) infection. What are the causes? This condition is caused by a virus. The virus may spread through contact with droplets or direct contact with infected people or their mucus or secretions. The virus may spread from person to person (is contagious). What are the signs or symptoms? Symptoms of this condition include: A stuffy or runny nose. A sore throat or cough. Shortness of breath or difficulty breathing. Yellow or green mucus (sputum). Other symptoms may include: A fever. Sweating or chills. Fatigue. Achy muscles. A headache. How is this diagnosed? This condition may be diagnosed based on: Your symptoms. A physical exam. Testing of secretions from the nose or throat. Chest X-ray. How is this treated? This condition may be treated with medicines, such as: Antiviral medicine. This may shorten the length of time a person has symptoms. Expectorants. These make it easier to cough up mucus. Decongestant nasal sprays. Acetaminophen or NSAIDs, such as ibuprofen, to relieve fever and pain. Antibiotic medicines are not prescribed for viral infections.This is because antibiotics are designed to kill bacteria. They do not kill viruses. Follow these instructions at home: Managing pain and congestion Take over-the-counter and prescription medicines only as told by your health care provider. If you have a sore throat, gargle with a mixture of salt and water 3-4 times a day or as needed. To make salt water, completely dissolve -1 tsp (3-6 g) of salt in 1 cup (237 mL) of warm water. Use nose drops made from salt water to ease congestion and  soften raw skin around your nose. Take 2 tsp (10 mL) of honey at bedtime to lessen coughing at night. Do not give honey to children who are younger than 1 year. Drink enough fluid to keep your urine pale yellow. This helps prevent dehydration and helps loosen up mucus. General instructions  Rest as much as possible. Do not drink alcohol. Do not use any products that contain nicotine or tobacco. These products include cigarettes, chewing tobacco, and vaping devices, such as e-cigarettes. If you need help quitting, ask your health care provider. Keep all follow-up visits. This is important. How is this prevented?     Get an annual flu shot. You may get the flu shot in late summer, fall, or winter. Ask your health care provider when you should get your flu shot. Avoid spreading your infection to other people. If you are sick: Wash your hands with soap and water often, especially after you cough or sneeze. Wash for at least 20 seconds. If soap and water are not available, use alcohol-based hand sanitizer. Cover your mouth when you cough. Cover your nose and mouth when you sneeze. Do not share cups or eating utensils. Clean commonly used objects often. Clean commonly touched surfaces. Stay home from work or school as told by your health care provider. Avoid contact with people who are sick during cold and flu season. This is generally fall and winter. Contact a health care provider if: Your symptoms last for 10 days or longer. Your symptoms get worse over time. You have severe sinus pain in your face or forehead. The glands in your jaw or neck become very swollen. You have shortness of breath. Get  help right away if you: Feel pain or pressure in your chest. Have trouble breathing. Faint or feel like you will faint. Have severe and persistent vomiting. Feel confused or disoriented. These symptoms may represent a serious problem that is an emergency. Do not wait to see if the symptoms will  go away. Get medical help right away. Call your local emergency services (911 in the [place] Do not drive yourself to the hospital. Summary A respiratory infection is an illness that affects part of the respiratory system, such as the lungs, nose, or throat. A respiratory infection that is caused by a virus is called a viral respiratory infection. Common types of viral respiratory infections include a cold, influenza, and respiratory syncytial virus (RSV) infection. Symptoms of this condition include a stuffy or runny nose, cough, fatigue, achy muscles, sore throat, and fevers or chills. Antibiotic medicines are not prescribed for viral infections. This is because antibiotics are designed to kill bacteria. They are not effective against viruses. This information is not intended to replace advice given to you by your health care provider. Make sure you discuss any questions you have with your health care provider. Document Revised: 08/19/2020 Document Reviewed: 08/19/2020 Elsevier Patient Education  2024 [employer].

## 2023-11-19 NOTE — Progress Notes (Signed)
 Subjective:  Patient ID: Kathy Stanley, female    DOB: 09/26/1975  Age: 48 y.o. MRN: [id number]  CC: URI   HPI Kathy Stanley presents for f/up ----  Discussed the use of AI scribe software for clinical note transcription with the patient, who gave verbal consent to proceed.  History of Present Illness   Kathy Stanley is a 48 year old female who presents with weight loss and respiratory symptoms.  She has experienced weight loss over the past couple of weeks and is pleased with the change, noting that her clothes fit better. No nausea or vomiting associated with this weight loss.  She has been experiencing heartburn and an upset stomach, which she attributes to a medication intended to reduce her appetite. No nausea or vomiting is present.  She experienced chest pain yesterday, along with coughing, dyspnea, rhinorrhea, and a scratchy throat. These symptoms began on Friday morning after a wisdom tooth extraction. She feels unwell in the mornings and her throat has been sore since Friday. No fever, chills, night sweats, or hemoptysis, but there is some phlegm production.  She denies any side effects from her current Adderall use, which she takes during the week. She sometimes forgets to eat but manages to do so. She previously experienced irritability and anger with regular Adderall but does not have these issues with the extended-release form.  She has not yet completed her mammogram or colon cancer screening, although she has a Cologuard test kit at home.       Outpatient Medications Prior to Visit  Medication Sig Dispense Refill   sertraline  (ZOLOFT ) 100 MG tablet Take 2 tablets (200 mg total) by mouth daily. 60 tablet 0   amphetamine -dextroamphetamine (ADDERALL XR) 20 MG 24 hr capsule Take 1 capsule by mouth every morning 30 capsule 0   WEGOVY  1.7 MG/0.75ML SOAJ INJECT 1.7 MG UNDER THE SKIN ONCE WEEKLY 3 mL 0   No facility-administered medications prior to visit.    ROS Review of  Systems  Constitutional:  Negative for appetite change, chills, diaphoresis, fatigue and fever.  HENT:  Positive for postnasal drip, rhinorrhea and sore throat. Negative for congestion, nosebleeds, sinus pressure, sinus pain, sneezing, trouble swallowing and voice change.   Respiratory:  Positive for cough. Negative for chest tightness, shortness of breath and wheezing.   Cardiovascular:  Positive for chest pain. Negative for palpitations and leg swelling.  Gastrointestinal: Negative.  Negative for abdominal pain, constipation, diarrhea, nausea and vomiting.  Genitourinary: Negative.   Musculoskeletal: Negative.  Negative for arthralgias and myalgias.  Skin: Negative.  Negative for rash.  Neurological: Negative.  Negative for dizziness and weakness.  Hematological:  Negative for adenopathy. Does not bruise/bleed easily.  Psychiatric/Behavioral: Negative.      Objective:  BP 122/80 (BP Location: Left Arm, Patient Position: Sitting, Cuff Size: Normal)   Pulse 85   Temp 98.4 F (36.9 C) (Oral)   Resp 16   Ht 5' (1.524 m)   Wt 145 lb (65.8 kg)   LMP 11/04/2023 (Exact Date)   SpO2 98%   BMI 28.32 kg/m   BP Readings from Last 3 Encounters:  11/19/23 122/80  06/05/23 110/86  05/08/23 130/84    Wt Readings from Last 3 Encounters:  11/19/23 145 lb (65.8 kg)  06/05/23 156 lb (70.8 kg)  05/31/22 169 lb (76.7 kg)    Physical Exam Vitals reviewed.  Constitutional:      Appearance: Normal appearance.  HENT:     Nose: Nose normal.  Mouth/Throat:     Mouth: Mucous membranes are moist.     Pharynx: Oropharynx is clear. No pharyngeal swelling, oropharyngeal exudate or posterior oropharyngeal erythema.   Eyes:     General: No scleral icterus.    Conjunctiva/sclera: Conjunctivae normal.   Neck:     Thyroid: No thyromegaly.   Cardiovascular:     Rate and Rhythm: Normal rate and regular rhythm.     Heart sounds: No murmur heard.    No friction rub. No gallop.  Pulmonary:      Effort: Pulmonary effort is normal.     Breath sounds: No stridor. No wheezing, rhonchi or rales.  Abdominal:     General: Abdomen is flat.     Palpations: There is no mass.     Tenderness: There is no abdominal tenderness. There is no guarding.     Hernia: No hernia is present.   Musculoskeletal:        General: Normal range of motion.     Cervical back: Neck supple.     Right lower leg: No edema.  Lymphadenopathy:     Cervical: No cervical adenopathy.   Skin:    General: Skin is warm and dry.     Findings: No rash.   Neurological:     General: No focal deficit present.     Mental Status: She is alert. Mental status is at baseline.   Psychiatric:        Mood and Affect: Mood normal.        Behavior: Behavior normal.     Lab Results  Component Value Date   WBC 7.0 06/05/2023   HGB 12.9 06/05/2023   HCT 38.9 06/05/2023   PLT 349.0 06/05/2023   GLUCOSE 83 06/05/2023   CHOL 215 (H) 05/31/2022   TRIG 87.0 05/31/2022   HDL 49.10 05/31/2022   LDLCALC 148 (H) 05/31/2022   ALT 10 06/05/2023   AST 20 06/05/2023   NA 135 06/05/2023   K 4.4 06/05/2023   CL 101 06/05/2023   CREATININE 0.66 06/05/2023   BUN 13 06/05/2023   CO2 29 06/05/2023   TSH 1.55 06/05/2023   HGBA1C 5.3 06/05/2023    No results found.  Assessment & Plan:   Viral URI with cough- Reassurance offered. Will treat the symptoms. -     POC COVID-19 BinaxNow  ADHD (attention deficit hyperactivity disorder), inattentive type -     Amphetamine -Dextroamphet ER; Take 1 capsule by mouth every morning  Dispense: 30 capsule; Refill: 0  Class 1 obesity due to excess calories with serious comorbidity and body mass index (BMI) of 30.0 to 30.9 in adult -     Wegovy ; Inject 1.7 mg into the skin once a week.  Dispense: 9 mL; Refill: 1  Screening mammogram for breast cancer -     Digital Screening Mammogram, Left and Right; Future  Screening for colon cancer -     Ambulatory referral to  Gastroenterology  Cervical cancer screening -     Ambulatory referral to Gynecology     Follow-up: Return in about 6 months (around 05/20/2024).  Debby Molt, MD

## 2023-11-20 ENCOUNTER — Telehealth: Payer: Self-pay

## 2023-11-20 ENCOUNTER — Other Ambulatory Visit (HOSPITAL_COMMUNITY): Payer: Self-pay

## 2023-11-20 NOTE — Telephone Encounter (Signed)
 Pharmacy Patient Advocate Encounter   Received notification from CoverMyMeds that prior authorization for wegovy  is required/requested.   Insurance verification completed.   The patient is insured through [facility] .   Per test claim: PA required; PA submitted to above mentioned insurance via CoverMyMeds Key/confirmation #/EOC [facility] Status is pending

## 2023-11-22 ENCOUNTER — Other Ambulatory Visit (HOSPITAL_COMMUNITY): Payer: Self-pay

## 2023-11-22 NOTE — Telephone Encounter (Signed)
 Pharmacy Patient Advocate Encounter  Received notification from [facility] that Prior Authorization for Wegovy  1.7 has been approved. Last fill 11/21/23 next fill date 12/12/23   PA #/Case ID/Reference #: [id number]

## 2023-11-23 NOTE — Telephone Encounter (Signed)
 Patient has been made aware.

## 2024-01-02 ENCOUNTER — Other Ambulatory Visit: Payer: Self-pay | Admitting: Medical Genetics

## 2024-01-02 ENCOUNTER — Encounter: Payer: Self-pay | Admitting: Internal Medicine

## 2024-01-04 ENCOUNTER — Other Ambulatory Visit: Payer: Self-pay

## 2024-01-04 DIAGNOSIS — F900 Attention-deficit hyperactivity disorder, predominantly inattentive type: Secondary | ICD-10-CM

## 2024-01-05 MED ORDER — AMPHETAMINE-DEXTROAMPHET ER 20 MG PO CP24: ORAL_CAPSULE | ORAL | 0 refills | Status: DC

## 2024-01-21 ENCOUNTER — Ambulatory Visit

## 2024-01-21 ENCOUNTER — Other Ambulatory Visit (HOSPITAL_COMMUNITY): Payer: Self-pay

## 2024-01-21 ENCOUNTER — Ambulatory Visit
Admission: RE | Admit: 2024-01-21 | Discharge: 2024-01-21 | Disposition: A | Discharge status: Home / Self Care | Source: Ambulatory Visit | Attending: Internal Medicine | Admitting: Internal Medicine

## 2024-01-21 VITALS — BP 113/77 | HR 82 | Temp 98.0°F | Resp 17

## 2024-01-21 DIAGNOSIS — U071 COVID-19: Secondary | ICD-10-CM | POA: Diagnosis not present

## 2024-01-21 DIAGNOSIS — R0981 Nasal congestion: Secondary | ICD-10-CM

## 2024-01-21 DIAGNOSIS — H6123 Impacted cerumen, bilateral: Secondary | ICD-10-CM | POA: Diagnosis not present

## 2024-01-21 LAB — POC SOFIA SARS ANTIGEN FIA: SARS Coronavirus 2 Ag: POSITIVE — AB

## 2024-01-21 MED ORDER — ALBUTEROL SULFATE HFA 108 (90 BASE) MCG/ACT IN AERS
2.0000 | INHALATION_SPRAY | Freq: Once | RESPIRATORY_TRACT | Status: AC
  Administered 2024-01-21: 2 via RESPIRATORY_TRACT

## 2024-01-21 MED ORDER — AEROCHAMBER PLUS FLO-VU MEDIUM MISC
1.0000 | Freq: Once | Status: AC
  Administered 2024-01-21: 1

## 2024-01-21 MED ORDER — PAXLOVID (300/100) 20 X 150 MG & 10 X 100MG PO TBPK
3.0000 | ORAL_TABLET | Freq: Two times a day (BID) | ORAL | 0 refills | Status: AC
  Filled 2024-01-21: qty 30, 5d supply, fill #0

## 2024-01-21 NOTE — ED Provider Notes (Signed)
 [facility]    CSN: [id number] Arrival date & time: 01/21/24  1409      History   Chief Complaint Chief Complaint  Patient presents with   Headache    Sinus headache, I've had severe cold symptoms since Thursday morning. - Entered by patient    HPI Kathy Stanley is a 48 y.o. female.   Kathy Stanley is a 48 y.o. female presenting for chief complaint of Headache, sinus congestion, cough, fatigue, and sore throat that started 4 days ago. Today is day 5 of symptoms. Cough is minimally productive and is associated with intermittent shortness of breath.  Her boyfriend is sick with similar symptoms.  Denies visual disturbances, dizziness, fever/chills, nausea, vomiting, diarrhea, and rash.  She additionally reports bilateral ear fullness/itching.  Never smoker, denies history of asthma/COPD. Taking over-the-counter Robitussin with some relief.   Headache   History reviewed. No pertinent past medical history.  Patient Active Problem List   Diagnosis Date Noted   Overweight 11/19/2023   Generalized anxiety disorder 11/19/2023   Adult general medical exam 11/19/2023   Viral URI with cough 11/19/2023   Class 1 obesity due to excess calories with serious comorbidity and body mass index (BMI) of 30.0 to 30.9 in adult 11/19/2023   Screening mammogram for breast cancer 06/05/2023   Screening for colon cancer 06/05/2023   ADHD (attention deficit hyperactivity disorder), inattentive type 05/31/2022   Recurrent major depressive disorder, in full remission (HCC) 05/31/2022   Cervical cancer screening 05/31/2022   Encounter for general adult medical examination with abnormal findings 05/31/2022   Screen for colon cancer 05/31/2022    History reviewed. No pertinent surgical history.  OB History   No obstetric history on file.      Home Medications    Prior to Admission medications   Medication Sig Start Date End Date Taking? Authorizing Provider   nirmatrelvir /ritonavir  (PAXLOVID , 300/100,) 20 x 150 MG & 10 x 100MG  TBPK Take 3 tablets by mouth 2 (two) times daily for 5 days. Patient GFR is 104. Take nirmatrelvir  (150 mg) two tablets twice daily for 5 days and ritonavir  (100 mg) one tablet twice daily for 5 days. 01/21/24 01/26/24 Yes Enedelia Dorna HERO, FNP  amphetamine -dextroamphetamine (ADDERALL XR) 20 MG 24 hr capsule Take 1 capsule by mouth every morning 01/05/24   Joshua Debby CROME, MD  Semaglutide -Weight Management (WEGOVY ) 1.7 MG/0.75ML SOAJ Inject 1.7 mg into the skin once a week. 11/19/23   Joshua Debby CROME, MD  sertraline  (ZOLOFT ) 100 MG tablet Take 2 tablets (200 mg total) by mouth daily. 05/08/23   Loreda Myla SAUNDERS, NP    Family History Family History  Problem Relation Age of Onset   Bipolar disorder Mother     Social History Social History   Tobacco Use   Smoking status: Never   Smokeless tobacco: Never  Vaping Use   Vaping status: Never Used  Substance Use Topics   Alcohol use: Yes    Alcohol/week: 14.0 standard drinks of alcohol    Types: 14 Glasses of wine per week   Drug use: Never     Allergies   Sulfa antibiotics, Sulfacetamide sodium-sulfur, and Sulfamethoxazole-trimethoprim   Review of Systems Review of Systems  Neurological:  Positive for headaches.  Per HPI   Physical Exam Triage Vital Signs ED Triage Vitals  Encounter Vitals Group     BP 01/21/24 1419 113/77     Girls Systolic BP Percentile --      Girls Diastolic BP Percentile --  Boys Systolic BP Percentile --      Boys Diastolic BP Percentile --      Pulse Rate 01/21/24 1419 82     Resp 01/21/24 1419 17     Temp 01/21/24 1419 98 F (36.7 C)     Temp Source 01/21/24 1419 Oral     SpO2 01/21/24 1419 97 %     Weight --      Height --      Head Circumference --      Peak Flow --      Pain Score 01/21/24 1422 4     Pain Loc --      Pain Education --      Exclude from Growth Chart --    No data found.  Updated Vital Signs BP  113/77 (BP Location: Right Arm)   Pulse 82   Temp 98 F (36.7 C) (Oral)   Resp 17   LMP 12/27/2023 (Approximate)   SpO2 97%   Visual Acuity Right Eye Distance:   Left Eye Distance:   Bilateral Distance:    Right Eye Near:   Left Eye Near:    Bilateral Near:     Physical Exam Vitals and nursing note reviewed.  Constitutional:      Appearance: She is not ill-appearing or toxic-appearing.  HENT:     Head: Normocephalic and atraumatic.     Right Ear: Hearing, tympanic membrane, ear canal and external ear normal. There is impacted cerumen.     Left Ear: Hearing, tympanic membrane, ear canal and external ear normal. There is impacted cerumen.     Nose: Nose normal.     Mouth/Throat:     Lips: Pink.     Mouth: Mucous membranes are moist. No injury or oral lesions.     Dentition: Normal dentition.     Tongue: No lesions.     Pharynx: Oropharynx is clear. Uvula midline. No pharyngeal swelling, oropharyngeal exudate, posterior oropharyngeal erythema, uvula swelling or postnasal drip.     Tonsils: No tonsillar exudate.  Eyes:     General: Lids are normal. Vision grossly intact. Gaze aligned appropriately.     Extraocular Movements: Extraocular movements intact.     Conjunctiva/sclera: Conjunctivae normal.  Neck:     Trachea: Trachea and phonation normal.  Cardiovascular:     Rate and Rhythm: Normal rate and regular rhythm.     Heart sounds: Normal heart sounds, S1 normal and S2 normal.  Pulmonary:     Effort: Pulmonary effort is normal. No respiratory distress.     Breath sounds: Normal breath sounds and air entry. No wheezing, rhonchi or rales.     Comments: Coarse breath sounds throughout bilaterally.  Speaking in full sentences without difficulty Chest:     Chest wall: No tenderness.  Musculoskeletal:     Cervical back: Neck supple.  Lymphadenopathy:     Cervical: No cervical adenopathy.  Skin:    General: Skin is warm and dry.     Capillary Refill: Capillary refill  takes less than 2 seconds.     Findings: No rash.  Neurological:     General: No focal deficit present.     Mental Status: She is alert and oriented to person, place, and time. Mental status is at baseline.     Cranial Nerves: No dysarthria or facial asymmetry.  Psychiatric:        Mood and Affect: Mood normal.        Speech: Speech normal.  Behavior: Behavior normal.        Thought Content: Thought content normal.        Judgment: Judgment normal.      UC Treatments / Results  Labs (all labs ordered are listed, but only abnormal results are displayed) Labs Reviewed  POC SOFIA SARS ANTIGEN FIA - Abnormal; Notable for the following components:      Result Value   SARS Coronavirus 2 Ag Positive (*)    All other components within normal limits    EKG   Radiology No results found.  Procedures Procedures (including critical care time)  Medications Ordered in UC Medications  albuterol  (VENTOLIN  HFA) 108 (90 Base) MCG/ACT inhaler 2 puff (has no administration in time range)  AeroChamber Plus Flo-Vu Medium MISC 1 each (has no administration in time range)    Initial Impression / Assessment and Plan / UC Course  I have reviewed the triage vital signs and the nursing notes.  Pertinent labs & imaging results that were available during my care of the patient were reviewed by me and considered in my medical decision making (see chart for details).   1.  COVID-19 virus infection, nasal congestion COVID testing positive in clinic.  Lungs clear, vitals hemodynamically stable, therefore deferred imaging of the chest.  Patient is a candidate for antiviral (Paxlovid ).  Antiviral sent to pharmacy.  Most recent GFR greater than 60 based on blood work from January 2025.  Daily medications checked for interactions with [place] medication checker tool prior to prescribing.  Hold Adderall while taking antiviral, then resume after finished.  Discussed most up to date CDC  guidelines regarding quarantine and masking to prevent transmission.  Recommend supportive care for symptomatic relief as outlined in AVS.    2. Bilateral impacted cerumen Right ear cleaned with ear lavage to remove ear wax impactions bilaterally by nursing staff.  I was able to remove all of the earwax from the left ear with a curette.  Attempted to remove earwax from right ear with curette but patient did not tolerate this well.  Reassessment shows normal tympanic membranes bilaterally without signs of AOM/AOE.    Patient may use debrox ear drops at home as needed for wax removal and has been advised to avoid using Q-tips.   Counseled patient on potential for adverse effects with medications prescribed/recommended today, strict ER and return-to-clinic precautions discussed, patient verbalized understanding.    Final Clinical Impressions(s) / UC Diagnoses   Final diagnoses:  Nasal congestion  COVID-19 virus infection  Bilateral impacted cerumen     Discharge Instructions      You have COVID-19 viral illness. Wear a mask for 5 days of symptoms. You may return to public within those 5 days as long as you do not have a fever. Wash hands frequently.  Take paxlovid  as prescribed. Start medicine tonight. 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  Do not take your Adderall while taking paxlovid .   - Use over the counter medicines to help with symptoms as discussed: Tylenol , guaifenesin (mucinex), zyrtec, etc - Two teaspoons of honey in warm water every 4-6 hours may help with throat pains - Humidifier in your room at night to help add water the air and soothe cough  If you develop any new or worsening symptoms or do not improve in the next 2 to 3 days, please return.  If your symptoms are severe, please go to the emergency room.  Follow-up with PCP as  needed.     ED Prescriptions     Medication Sig Dispense Auth. Provider   nirmatrelvir /ritonavir   (PAXLOVID , 300/100,) 20 x 150 MG & 10 x 100MG  TBPK Take 3 tablets by mouth 2 (two) times daily for 5 days. Patient GFR is 104. Take nirmatrelvir  (150 mg) two tablets twice daily for 5 days and ritonavir  (100 mg) one tablet twice daily for 5 days. 30 tablet Enedelia Dorna HERO, FNP      PDMP not reviewed this encounter.   Enedelia Dorna HERO, [place] 01/21/24 [phone]

## 2024-01-21 NOTE — ED Triage Notes (Signed)
 Pt states Thursday she worke up with sore throat, congestion, cough, ear fullness, fatigue. Pt still not feeling well today.

## 2024-01-21 NOTE — Discharge Instructions (Addendum)
 You have COVID-19 viral illness. Wear a mask for 5 days of symptoms. You may return to public within those 5 days as long as you do not have a fever. Wash hands frequently.  Take paxlovid  as prescribed. Start medicine tonight. 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  Do not take your Adderall while taking paxlovid .   - Use over the counter medicines to help with symptoms as discussed: Tylenol , guaifenesin (mucinex), zyrtec, etc - Two teaspoons of honey in warm water every 4-6 hours may help with throat pains - Humidifier in your room at night to help add water the air and soothe cough  If you develop any new or worsening symptoms or do not improve in the next 2 to 3 days, please return.  If your symptoms are severe, please go to the emergency room.  Follow-up with PCP as needed.

## 2024-03-03 ENCOUNTER — Other Ambulatory Visit: Payer: Self-pay | Admitting: Internal Medicine

## 2024-03-03 DIAGNOSIS — Z1211 Encounter for screening for malignant neoplasm of colon: Secondary | ICD-10-CM

## 2024-03-04 ENCOUNTER — Other Ambulatory Visit: Payer: Self-pay

## 2024-03-04 ENCOUNTER — Encounter: Payer: Self-pay | Admitting: Internal Medicine

## 2024-03-04 DIAGNOSIS — F3342 Major depressive disorder, recurrent, in full remission: Secondary | ICD-10-CM

## 2024-03-04 DIAGNOSIS — F900 Attention-deficit hyperactivity disorder, predominantly inattentive type: Secondary | ICD-10-CM

## 2024-03-04 MED ORDER — SERTRALINE HCL 100 MG PO TABS: 200.0000 mg | ORAL_TABLET | Freq: Every day | ORAL | 0 refills | Status: DC

## 2024-03-04 MED ORDER — AMPHETAMINE-DEXTROAMPHET ER 20 MG PO CP24: ORAL_CAPSULE | ORAL | 0 refills | Status: AC

## 2024-03-31 ENCOUNTER — Other Ambulatory Visit: Payer: Self-pay | Admitting: Medical Genetics

## 2024-03-31 DIAGNOSIS — Z006 Encounter for examination for normal comparison and control in clinical research program: Secondary | ICD-10-CM

## 2024-04-01 ENCOUNTER — Other Ambulatory Visit: Payer: Self-pay | Admitting: Internal Medicine

## 2024-04-01 DIAGNOSIS — E66811 Obesity, class 1: Secondary | ICD-10-CM

## 2024-04-04 ENCOUNTER — Other Ambulatory Visit: Payer: Self-pay | Admitting: Internal Medicine

## 2024-04-04 DIAGNOSIS — F3342 Major depressive disorder, recurrent, in full remission: Secondary | ICD-10-CM

## 2024-05-05 ENCOUNTER — Other Ambulatory Visit: Payer: Self-pay | Admitting: Internal Medicine

## 2024-05-05 DIAGNOSIS — F3342 Major depressive disorder, recurrent, in full remission: Secondary | ICD-10-CM

## 2024-06-17 ENCOUNTER — Encounter: Payer: Self-pay | Admitting: Internal Medicine

## 2024-07-22 ENCOUNTER — Ambulatory Visit: Admitting: Internal Medicine
